# Patient Record
Sex: Female | Born: 1975 | Race: White | Hispanic: No | Marital: Married | Smoking: Never smoker
Health system: Southern US, Community
[De-identification: ages and names within clinical notes are randomized; demographics above are authoritative.]

## PROBLEM LIST (undated history)

## (undated) DIAGNOSIS — N809 Endometriosis, unspecified: Secondary | ICD-10-CM

## (undated) DIAGNOSIS — F329 Major depressive disorder, single episode, unspecified: Secondary | ICD-10-CM

## (undated) DIAGNOSIS — R5382 Chronic fatigue, unspecified: Secondary | ICD-10-CM

## (undated) DIAGNOSIS — D649 Anemia, unspecified: Secondary | ICD-10-CM

## (undated) DIAGNOSIS — F419 Anxiety disorder, unspecified: Secondary | ICD-10-CM

## (undated) DIAGNOSIS — R109 Unspecified abdominal pain: Secondary | ICD-10-CM

## (undated) DIAGNOSIS — K644 Residual hemorrhoidal skin tags: Secondary | ICD-10-CM

## (undated) DIAGNOSIS — K529 Noninfective gastroenteritis and colitis, unspecified: Secondary | ICD-10-CM

## (undated) DIAGNOSIS — C801 Malignant (primary) neoplasm, unspecified: Secondary | ICD-10-CM

## (undated) DIAGNOSIS — K589 Irritable bowel syndrome without diarrhea: Secondary | ICD-10-CM

## (undated) DIAGNOSIS — R110 Nausea: Secondary | ICD-10-CM

## (undated) DIAGNOSIS — F32A Depression, unspecified: Secondary | ICD-10-CM

## (undated) HISTORY — DX: Irritable bowel syndrome without diarrhea: K58.9

## (undated) HISTORY — DX: Nausea: R11.0

## (undated) HISTORY — PX: OTHER SURGICAL HISTORY: SHX169

## (undated) HISTORY — DX: Residual hemorrhoidal skin tags: K64.4

## (undated) HISTORY — DX: Noninfective gastroenteritis and colitis, unspecified: K52.9

## (undated) HISTORY — PX: COLONOSCOPY: SHX174

## (undated) HISTORY — PX: WISDOM TOOTH EXTRACTION: SHX21

## (undated) HISTORY — DX: Chronic fatigue, unspecified: R53.82

## (undated) HISTORY — DX: Unspecified abdominal pain: R10.9

---

## 2010-06-20 ENCOUNTER — Emergency Department (HOSPITAL_COMMUNITY)
Admission: EM | Admit: 2010-06-20 | Discharge: 2010-06-21 | Disposition: A | Discharge status: Home / Self Care | Payer: BC Managed Care – PPO | Attending: Emergency Medicine | Admitting: Emergency Medicine

## 2010-06-20 DIAGNOSIS — R110 Nausea: Secondary | ICD-10-CM | POA: Insufficient documentation

## 2010-06-20 DIAGNOSIS — I498 Other specified cardiac arrhythmias: Secondary | ICD-10-CM | POA: Insufficient documentation

## 2010-06-20 DIAGNOSIS — R002 Palpitations: Secondary | ICD-10-CM | POA: Insufficient documentation

## 2010-06-21 LAB — CBC
HCT: 39.8 % (ref 36.0–46.0)
MCHC: 34.4 g/dL (ref 30.0–36.0)
MCV: 84.7 fL (ref 78.0–100.0)
Platelets: 206 10*3/uL (ref 150–400)
RDW: 12.6 % (ref 11.5–15.5)
WBC: 10.6 10*3/uL — ABNORMAL HIGH (ref 4.0–10.5)

## 2010-06-21 LAB — DIFFERENTIAL
Basophils Absolute: 0 10*3/uL (ref 0.0–0.1)
Eosinophils Absolute: 0 10*3/uL (ref 0.0–0.7)
Eosinophils Relative: 0 % (ref 0–5)
Lymphocytes Relative: 10 % — ABNORMAL LOW (ref 12–46)
Lymphs Abs: 1.1 10*3/uL (ref 0.7–4.0)
Monocytes Absolute: 0.5 10*3/uL (ref 0.1–1.0)

## 2010-06-21 LAB — COMPREHENSIVE METABOLIC PANEL
Albumin: 4 g/dL (ref 3.5–5.2)
BUN: 11 mg/dL (ref 6–23)
Calcium: 9.7 mg/dL (ref 8.4–10.5)
Glucose, Bld: 140 mg/dL — ABNORMAL HIGH (ref 70–99)
Total Protein: 7.3 g/dL (ref 6.0–8.3)

## 2010-06-21 LAB — LIPASE, BLOOD: Lipase: 31 U/L (ref 11–59)

## 2010-07-11 ENCOUNTER — Ambulatory Visit (INDEPENDENT_AMBULATORY_CARE_PROVIDER_SITE_OTHER): Payer: BC Managed Care – PPO | Admitting: Internal Medicine

## 2010-07-11 DIAGNOSIS — R197 Diarrhea, unspecified: Secondary | ICD-10-CM

## 2010-07-11 DIAGNOSIS — R198 Other specified symptoms and signs involving the digestive system and abdomen: Secondary | ICD-10-CM

## 2010-07-29 DIAGNOSIS — F419 Anxiety disorder, unspecified: Secondary | ICD-10-CM | POA: Insufficient documentation

## 2010-07-30 ENCOUNTER — Emergency Department (HOSPITAL_COMMUNITY): Payer: BC Managed Care – PPO

## 2010-07-30 ENCOUNTER — Emergency Department (HOSPITAL_COMMUNITY)
Admission: EM | Admit: 2010-07-30 | Discharge: 2010-07-30 | Disposition: A | Discharge status: Home / Self Care | Payer: BC Managed Care – PPO | Attending: Emergency Medicine | Admitting: Emergency Medicine

## 2010-07-30 DIAGNOSIS — E860 Dehydration: Secondary | ICD-10-CM | POA: Insufficient documentation

## 2010-07-30 DIAGNOSIS — R109 Unspecified abdominal pain: Secondary | ICD-10-CM | POA: Insufficient documentation

## 2010-07-30 DIAGNOSIS — R112 Nausea with vomiting, unspecified: Secondary | ICD-10-CM | POA: Insufficient documentation

## 2010-07-30 LAB — DIFFERENTIAL
Eosinophils Absolute: 0.1 10*3/uL (ref 0.0–0.7)
Lymphocytes Relative: 18 % (ref 12–46)
Lymphs Abs: 0.8 10*3/uL (ref 0.7–4.0)
Monocytes Relative: 8 % (ref 3–12)
Neutro Abs: 3.2 10*3/uL (ref 1.7–7.7)
Neutrophils Relative %: 73 % (ref 43–77)

## 2010-07-30 LAB — CBC
HCT: 37.2 % (ref 36.0–46.0)
Hemoglobin: 12.8 g/dL (ref 12.0–15.0)
MCH: 29.4 pg (ref 26.0–34.0)
MCV: 85.3 fL (ref 78.0–100.0)
Platelets: 209 10*3/uL (ref 150–400)
RBC: 4.36 MIL/uL (ref 3.87–5.11)
WBC: 4.4 10*3/uL (ref 4.0–10.5)

## 2010-07-30 LAB — COMPREHENSIVE METABOLIC PANEL
Albumin: 3.8 g/dL (ref 3.5–5.2)
Alkaline Phosphatase: 57 U/L (ref 39–117)
BUN: 6 mg/dL (ref 6–23)
CO2: 24 mEq/L (ref 19–32)
Chloride: 107 mEq/L (ref 96–112)
GFR calc non Af Amer: >60 mL/min (ref >60)
Potassium: 3.6 mEq/L (ref 3.5–5.1)
Total Bilirubin: 0.6 mg/dL (ref 0.3–1.2)

## 2010-08-04 ENCOUNTER — Other Ambulatory Visit (INDEPENDENT_AMBULATORY_CARE_PROVIDER_SITE_OTHER): Payer: Self-pay | Admitting: Internal Medicine

## 2010-08-04 DIAGNOSIS — R110 Nausea: Secondary | ICD-10-CM

## 2010-08-04 DIAGNOSIS — R197 Diarrhea, unspecified: Secondary | ICD-10-CM

## 2010-08-08 ENCOUNTER — Encounter (HOSPITAL_COMMUNITY): Payer: Self-pay

## 2010-08-08 ENCOUNTER — Encounter (HOSPITAL_COMMUNITY)
Admission: RE | Admit: 2010-08-08 | Discharge: 2010-08-08 | Disposition: A | Discharge status: Home / Self Care | Payer: BC Managed Care – PPO | Source: Ambulatory Visit | Attending: Internal Medicine | Admitting: Internal Medicine

## 2010-08-08 DIAGNOSIS — R197 Diarrhea, unspecified: Secondary | ICD-10-CM

## 2010-08-08 DIAGNOSIS — R110 Nausea: Secondary | ICD-10-CM

## 2010-08-08 DIAGNOSIS — R112 Nausea with vomiting, unspecified: Secondary | ICD-10-CM | POA: Insufficient documentation

## 2010-08-08 MED ORDER — TECHNETIUM TC 99M MEBROFENIN IV KIT
5.0000 | PACK | Freq: Once | INTRAVENOUS | Status: AC | PRN
  Administered 2010-08-08: 5 via INTRAVENOUS

## 2010-08-25 ENCOUNTER — Ambulatory Visit (HOSPITAL_COMMUNITY)
Admission: RE | Admit: 2010-08-25 | Discharge: 2010-08-25 | Disposition: A | Discharge status: Home / Self Care | Payer: BC Managed Care – PPO | Source: Ambulatory Visit | Attending: Internal Medicine | Admitting: Internal Medicine

## 2010-08-25 ENCOUNTER — Other Ambulatory Visit (INDEPENDENT_AMBULATORY_CARE_PROVIDER_SITE_OTHER): Payer: Self-pay | Admitting: Internal Medicine

## 2010-08-25 ENCOUNTER — Encounter (HOSPITAL_BASED_OUTPATIENT_CLINIC_OR_DEPARTMENT_OTHER): Payer: BC Managed Care – PPO | Admitting: Internal Medicine

## 2010-08-25 DIAGNOSIS — K644 Residual hemorrhoidal skin tags: Secondary | ICD-10-CM

## 2010-08-25 DIAGNOSIS — R197 Diarrhea, unspecified: Secondary | ICD-10-CM | POA: Insufficient documentation

## 2010-08-25 DIAGNOSIS — K5730 Diverticulosis of large intestine without perforation or abscess without bleeding: Secondary | ICD-10-CM | POA: Insufficient documentation

## 2010-08-25 DIAGNOSIS — R634 Abnormal weight loss: Secondary | ICD-10-CM | POA: Insufficient documentation

## 2010-08-25 HISTORY — PX: COLONOSCOPY: SHX174

## 2010-09-05 ENCOUNTER — Other Ambulatory Visit (INDEPENDENT_AMBULATORY_CARE_PROVIDER_SITE_OTHER): Payer: Self-pay | Admitting: Internal Medicine

## 2010-09-05 DIAGNOSIS — R197 Diarrhea, unspecified: Secondary | ICD-10-CM

## 2010-09-05 DIAGNOSIS — R110 Nausea: Secondary | ICD-10-CM

## 2010-09-08 ENCOUNTER — Ambulatory Visit (HOSPITAL_COMMUNITY): Payer: BC Managed Care – PPO

## 2010-09-11 NOTE — Op Note (Signed)
  Kristen Mullins, Kristen Mullins                   ACCOUNT NO.:  [id number]  MEDICAL RECORD NO.:  [id number]           PATIENT TYPE:  O  LOCATION:  [facility]                          FACILITY:  [facility]  PHYSICIAN:  Lionel December, M.D.    DATE OF BIRTH:  [birth year]  DATE OF PROCEDURE:  08/25/2010 DATE OF DISCHARGE:                              OPERATIVE REPORT   PROCEDURE:  Colonoscopy with terminal ileoscopy.  INDICATION:  Kristen Mullins is a 35 year old Caucasian female who has had diarrhea off and on for several years, but worse in the last 9 months.  She was felt to have IBS, but she has not responded well to therapy.  She is, therefore, undergoing diagnostic exam.  Since middle of March, she has lost 10 pounds.  Procedure risks were reviewed with the patient and informed consent was obtained.  MEDICATIONS FOR CONSCIOUS SEDATION: 1. Demerol 25 mg IV. 2. Versed 10 mg IV in divided dose.  FINDINGS:  Procedure performed in endoscopy suite.  The patient's vital signs and O2 sats were monitored during the procedure and remained stable.  The patient was placed in left lateral recumbent position and rectal examination performed.  No abnormality noted on external or digital exam.  Pentax videoscope was placed through rectum and advanced under vision into sigmoid colon beyond.  Preparation was satisfactory. Scope was passed into cecum which was identified by appendiceal orifice and ileocecal valve.  Short segment of GI was also examined and was normal.  There was single small diverticulum at the cecum.  As the scope was withdrawn, colonic mucosa was carefully examined and was normal throughout.  Random biopsies were taken from mucosa of the sigmoid colon looking for microscopic colitis.  Rectal mucosa was normal.  Scope was retroflexed to examine anorectal junction and small hemorrhoids noted below the dentate line.  Endoscope was straightened and withdrawn. Withdrawal time was 11 minutes.  The patient tolerated the  procedure well.  FINAL DIAGNOSES: 1. Normal terminal ileum. 2. Normal colonoscopy except single cecal diverticulum. 3. Random biopsies taken from mucosa of sigmoid colon looking for     microscopic and/or collagenous colitis. 4. External hemorrhoids.  RECOMMENDATIONS: 1. High-fiber diet plus fiber supplement 3-4 g daily. 2. Dicyclomine 10 mg before breakfast and evening meal, prescription     given for 60 with 5 refills. 3. She was also given prescription for promethazine 25 mg tablets 20     that she can take half to 1 tablet b.i.d. p.r.n.     Lionel December, M.D.     NR/MEDQ  D:  08/25/2010  T:  08/26/2010  Job:  [id number]  cc:   Ernestina Penna, M.D. Fax: [phone]  Electronically Signed by Lionel December M.D. on 09/11/2010 09:46:08 PM

## 2010-09-12 ENCOUNTER — Ambulatory Visit (INDEPENDENT_AMBULATORY_CARE_PROVIDER_SITE_OTHER): Payer: BC Managed Care – PPO | Admitting: Internal Medicine

## 2010-09-12 DIAGNOSIS — R110 Nausea: Secondary | ICD-10-CM

## 2010-09-12 DIAGNOSIS — R634 Abnormal weight loss: Secondary | ICD-10-CM

## 2010-09-12 DIAGNOSIS — R197 Diarrhea, unspecified: Secondary | ICD-10-CM

## 2011-01-02 ENCOUNTER — Encounter (INDEPENDENT_AMBULATORY_CARE_PROVIDER_SITE_OTHER): Payer: Self-pay | Admitting: *Deleted

## 2011-01-18 ENCOUNTER — Encounter (INDEPENDENT_AMBULATORY_CARE_PROVIDER_SITE_OTHER): Payer: Self-pay

## 2011-01-26 ENCOUNTER — Ambulatory Visit (INDEPENDENT_AMBULATORY_CARE_PROVIDER_SITE_OTHER): Payer: BC Managed Care – PPO | Admitting: Internal Medicine

## 2012-10-28 ENCOUNTER — Ambulatory Visit (INDEPENDENT_AMBULATORY_CARE_PROVIDER_SITE_OTHER): Payer: BC Managed Care – PPO | Admitting: Family Medicine

## 2012-10-28 ENCOUNTER — Encounter: Payer: Self-pay | Admitting: Family Medicine

## 2012-10-28 VITALS — BP 107/72 | HR 78 | Temp 99.1°F | Ht 69.0 in | Wt 126.6 lb

## 2012-10-28 DIAGNOSIS — Z0000 Encounter for general adult medical examination without abnormal findings: Secondary | ICD-10-CM

## 2012-10-28 DIAGNOSIS — F411 Generalized anxiety disorder: Secondary | ICD-10-CM

## 2012-10-28 LAB — POCT CBC
Granulocyte percent: 72.7 %G (ref 37–80)
HCT, POC: 38.8 % (ref 37.7–47.9)
Hemoglobin: 13.7 g/dL (ref 12.2–16.2)
Lymph, poc: 1.7 (ref 0.6–3.4)
MCH, POC: 29.9 pg (ref 27–31.2)
MCHC: 35.2 g/dL (ref 31.8–35.4)
MCV: 84.9 fL (ref 80–97)
MPV: 8.2 fL (ref 0–99.8)
POC Granulocyte: 5.2 (ref 2–6.9)
POC LYMPH PERCENT: 23.3 %L (ref 10–50)
Platelet Count, POC: 244 10*3/uL (ref 142–424)
RBC: 4.6 M/uL (ref 4.04–5.48)
RDW, POC: 13.5 %
WBC: 7.1 10*3/uL (ref 4.6–10.2)

## 2012-10-28 LAB — LIPID PANEL
Cholesterol: 166 mg/dL (ref 0–200)
HDL: 40 mg/dL (ref >39)
LDL Cholesterol: 82 mg/dL (ref 0–99)
Total CHOL/HDL Ratio: 4.2 Ratio
Triglycerides: 219 mg/dL — ABNORMAL HIGH (ref <150)
VLDL: 44 mg/dL — ABNORMAL HIGH (ref 0–40)

## 2012-10-28 LAB — TSH: TSH: 1.043 u[IU]/mL (ref 0.350–4.500)

## 2012-10-28 MED ORDER — ALPRAZOLAM 0.5 MG PO TABS: 0.5000 mg | ORAL_TABLET | Freq: Every day | ORAL | Status: DC

## 2012-10-28 NOTE — Patient Instructions (Signed)

## 2012-10-28 NOTE — Progress Notes (Signed)
  Subjective:    Patient ID: Kristen Mullins, female    DOB: 07-Nov-1975, 37 y.o.   MRN: [id number]  HPI This 37 y.o. female presents for evaluation of Anxiety.  She uses xanax one per day for anxiety. She has cut back from 2 a day.  She does use xanax bid on rare occasion.  She is not on maintenance GAD medication.  She has been on lexapro but had SE's and stopped.  She has no annual labs this year.  She sees OBGYN for female visits and is current.  She was told by OBGYN to take vitaminD otc.    Review of Systems    No chest pain, SOB, HA, dizziness, vision change, N/V, diarrhea, constipation, dysuria, urinary urgency or frequency, myalgias, arthralgias or rash.  Objective:   Physical Exam Vital signs noted  Well developed well nourished female in NAD.  HEENT - Head atraumatic Normocephalic                Eyes - PERRLA, Conjuctiva - clear Sclera- Clear EOMI                Ears - EAC's Wnl TM's Wnl Gross Hearing WNL                Nose - Nares patent                 Throat - oropharanx wnl Respiratory - Lungs CTA bilateral Cardiac - RRR S1 and S2 without murmur GI - Abdomen soft Nontender and bowel sounds active x 4 Extremities - No edema. Neuro - Grossly intact.       Assessment & Plan:  Routine general medical examination at a health care facility - Plan: POCT CBC, Lipid panel, COMPLETE METABOLIC PANEL WITH GFR, TSH.  Take VitaminD and calcium otc.  Follow up with OGBYN for female exam.  Generalized anxiety disorder - Plan: ALPRAZolam (XANAX) 0.5 MG tablet,  Discussed if she escalates xanax then would add an SSRI.  Follow up in 3-86months for xanax refill.

## 2012-10-29 LAB — COMPLETE METABOLIC PANEL WITH GFR
ALT: 21 U/L (ref 0–35)
AST: 17 U/L (ref 0–37)
Albumin: 3.9 g/dL (ref 3.5–5.2)
Alkaline Phosphatase: 42 U/L (ref 39–117)
BUN: 10 mg/dL (ref 6–23)
CO2: 26 mEq/L (ref 19–32)
Calcium: 9.3 mg/dL (ref 8.4–10.5)
Chloride: 104 mEq/L (ref 96–112)
Creat: 0.84 mg/dL (ref 0.50–1.10)
GFR, Est African American: >89 mL/min
GFR, Est Non African American: 89 mL/min
Glucose, Bld: 84 mg/dL (ref 70–99)
Potassium: 4.3 mEq/L (ref 3.5–5.3)
Sodium: 138 mEq/L (ref 135–145)
Total Bilirubin: 0.4 mg/dL (ref 0.3–1.2)
Total Protein: 6.8 g/dL (ref 6.0–8.3)

## 2012-12-24 ENCOUNTER — Other Ambulatory Visit: Payer: Self-pay | Admitting: Dermatology

## 2013-06-10 ENCOUNTER — Encounter (HOSPITAL_COMMUNITY): Payer: Self-pay | Admitting: Pharmacist

## 2013-06-16 ENCOUNTER — Other Ambulatory Visit: Payer: Self-pay | Admitting: Dermatology

## 2013-06-18 ENCOUNTER — Encounter (HOSPITAL_COMMUNITY)
Admission: RE | Admit: 2013-06-18 | Discharge: 2013-06-18 | Disposition: A | Discharge status: Home / Self Care | Payer: BC Managed Care – PPO | Source: Ambulatory Visit | Attending: Obstetrics and Gynecology | Admitting: Obstetrics and Gynecology

## 2013-06-18 ENCOUNTER — Encounter (HOSPITAL_COMMUNITY): Payer: Self-pay

## 2013-06-18 DIAGNOSIS — Z01812 Encounter for preprocedural laboratory examination: Secondary | ICD-10-CM | POA: Insufficient documentation

## 2013-06-18 HISTORY — DX: Depression, unspecified: F32.A

## 2013-06-18 HISTORY — DX: Anemia, unspecified: D64.9

## 2013-06-18 HISTORY — DX: Anxiety disorder, unspecified: F41.9

## 2013-06-18 HISTORY — DX: Malignant (primary) neoplasm, unspecified: C80.1

## 2013-06-18 HISTORY — DX: Major depressive disorder, single episode, unspecified: F32.9

## 2013-06-18 HISTORY — DX: Endometriosis, unspecified: N80.9

## 2013-06-18 LAB — CBC
HCT: 37.4 % (ref 36.0–46.0)
Hemoglobin: 13.1 g/dL (ref 12.0–15.0)
MCH: 29.6 pg (ref 26.0–34.0)
MCHC: 35 g/dL (ref 30.0–36.0)
MCV: 84.6 fL (ref 78.0–100.0)
PLATELETS: 300 10*3/uL (ref 150–400)
RBC: 4.42 MIL/uL (ref 3.87–5.11)
RDW: 12.7 % (ref 11.5–15.5)
WBC: 13.9 10*3/uL — ABNORMAL HIGH (ref 4.0–10.5)

## 2013-06-18 NOTE — Patient Instructions (Addendum)
   Your procedure is scheduled on:  Monday, Feb 16  Enter through the Main Entrance of [facility] at:  6 AM Pick up the phone at the desk and dial [phone] and inform us of your arrival.  Please call this number if you have any problems the morning of surgery: [phone]  Remember: Do not eat or drink after midnight: Sunday Take these medicines the morning of surgery with a SIP OF WATER:  Xanax if needed.  Do not wear jewelry, make-up, or FINGER nail polish No metal in your hair or on your body. Do not wear lotions, powders, perfumes.  You may wear deodorant.  Do not bring valuables to the hospital. Contacts, dentures or bridgework may not be worn into surgery.  Patients discharged on the day of surgery will not be allowed to drive home.  Home with husband Juanda Crumble cell  [phone] or sister Amy.

## 2013-06-20 ENCOUNTER — Other Ambulatory Visit: Payer: Self-pay | Admitting: Family Medicine

## 2013-06-20 ENCOUNTER — Other Ambulatory Visit: Payer: Self-pay | Admitting: *Deleted

## 2013-06-20 DIAGNOSIS — F411 Generalized anxiety disorder: Secondary | ICD-10-CM

## 2013-06-20 NOTE — Telephone Encounter (Signed)
ntbs

## 2013-06-20 NOTE — Telephone Encounter (Signed)
Patient last seen in office on 10-28-12. Rx last filled on 04-23-13. Please advise. If approved please route to Pool A so nurse can phone in to [facility] in [place]

## 2013-06-22 NOTE — H&P (Addendum)
38 yo with chronic pelvic pain presents for surgical mngt.  Despite extended cycle OCPs, she continues to have severe dysmenorrhea, n/v/d with menses.  PMHx:  allergies PSHx: neg All:  Hydrocodone Meds:  Camrese, xanex, allergy pill FHx:  Breast ca - PGM, lung ca SHx:  Negative  AF, VSS Gen - NAD Abd - soft, NT CV - RRR Lungs - clear PV - uterus mobile, NT  A/P:  Chronic pelvic pain Diagnostic Laparoscopy with possible fulgeration of endometriosis R/b/a of surgery discussed, informed consent Pt declines blood products.

## 2013-06-23 ENCOUNTER — Ambulatory Visit (HOSPITAL_COMMUNITY)
Admission: RE | Admit: 2013-06-23 | Discharge: 2013-06-23 | Disposition: A | Discharge status: Home / Self Care | Payer: BC Managed Care – PPO | Source: Ambulatory Visit | Attending: Obstetrics and Gynecology | Admitting: Obstetrics and Gynecology

## 2013-06-23 ENCOUNTER — Encounter (HOSPITAL_COMMUNITY)
Admission: RE | Disposition: A | Discharge status: Home / Self Care | Payer: Self-pay | Source: Ambulatory Visit | Attending: Obstetrics and Gynecology

## 2013-06-23 ENCOUNTER — Encounter (HOSPITAL_COMMUNITY): Payer: Self-pay | Admitting: *Deleted

## 2013-06-23 ENCOUNTER — Encounter (HOSPITAL_COMMUNITY): Payer: BC Managed Care – PPO | Admitting: Anesthesiology

## 2013-06-23 ENCOUNTER — Ambulatory Visit (HOSPITAL_COMMUNITY): Payer: BC Managed Care – PPO | Admitting: Anesthesiology

## 2013-06-23 DIAGNOSIS — Y9210 Unspecified residential institution as the place of occurrence of the external cause: Secondary | ICD-10-CM | POA: Insufficient documentation

## 2013-06-23 DIAGNOSIS — N946 Dysmenorrhea, unspecified: Secondary | ICD-10-CM | POA: Insufficient documentation

## 2013-06-23 DIAGNOSIS — N803 Endometriosis of pelvic peritoneum, unspecified: Secondary | ICD-10-CM | POA: Insufficient documentation

## 2013-06-23 DIAGNOSIS — IMO0002 Reserved for concepts with insufficient information to code with codable children: Secondary | ICD-10-CM | POA: Insufficient documentation

## 2013-06-23 DIAGNOSIS — N949 Unspecified condition associated with female genital organs and menstrual cycle: Secondary | ICD-10-CM | POA: Insufficient documentation

## 2013-06-23 HISTORY — PX: LAPAROSCOPY: SHX197

## 2013-06-23 SURGERY — LAPAROSCOPY OPERATIVE
Anesthesia: General | Site: Abdomen

## 2013-06-23 MED ORDER — FERRIC SUBSULFATE 259 MG/GM EX SOLN
CUTANEOUS | Status: DC | PRN
  Administered 2013-06-23: 1

## 2013-06-23 MED ORDER — MIDAZOLAM HCL 2 MG/2ML IJ SOLN
INTRAMUSCULAR | Status: DC | PRN
  Administered 2013-06-23: 2 mg via INTRAVENOUS

## 2013-06-23 MED ORDER — KETOROLAC TROMETHAMINE 30 MG/ML IJ SOLN
INTRAMUSCULAR | Status: AC
  Filled 2013-06-23: qty 1

## 2013-06-23 MED ORDER — KETOROLAC TROMETHAMINE 60 MG/2ML IM SOLN
INTRAMUSCULAR | Status: DC | PRN
  Administered 2013-06-23: 30 mg via INTRAMUSCULAR

## 2013-06-23 MED ORDER — GLYCOPYRROLATE 0.2 MG/ML IJ SOLN
INTRAMUSCULAR | Status: DC | PRN
  Administered 2013-06-23: 0.1 mg via INTRAVENOUS
  Administered 2013-06-23: 0.6 mg via INTRAVENOUS

## 2013-06-23 MED ORDER — METOCLOPRAMIDE HCL 5 MG/ML IJ SOLN: 10.0000 mg | Freq: Once | INTRAMUSCULAR | Status: DC | PRN

## 2013-06-23 MED ORDER — FENTANYL CITRATE 0.05 MG/ML IJ SOLN
INTRAMUSCULAR | Status: AC
Start: 2013-06-23 — End: 2013-06-23
  Filled 2013-06-23: qty 5

## 2013-06-23 MED ORDER — FENTANYL CITRATE 0.05 MG/ML IJ SOLN: 25.0000 ug | INTRAMUSCULAR | Status: DC | PRN

## 2013-06-23 MED ORDER — BUPIVACAINE HCL (PF) 0.25 % IJ SOLN
INTRAMUSCULAR | Status: AC
  Filled 2013-06-23: qty 30

## 2013-06-23 MED ORDER — ONDANSETRON HCL 4 MG/2ML IJ SOLN
INTRAMUSCULAR | Status: DC | PRN
  Administered 2013-06-23: 4 mg via INTRAVENOUS

## 2013-06-23 MED ORDER — HYDROMORPHONE HCL 2 MG PO TABS: 1.0000 mg | ORAL_TABLET | ORAL | Status: DC | PRN

## 2013-06-23 MED ORDER — DEXTROSE 5 % IV SOLN
2.0000 g | INTRAVENOUS | Status: AC
  Administered 2013-06-23: 2 g via INTRAVENOUS
  Filled 2013-06-23: qty 2

## 2013-06-23 MED ORDER — ACETAMINOPHEN 160 MG/5ML PO SOLN
ORAL | Status: AC
  Filled 2013-06-23: qty 40.6

## 2013-06-23 MED ORDER — NEOSTIGMINE METHYLSULFATE 1 MG/ML IJ SOLN
INTRAMUSCULAR | Status: AC
Start: 2013-06-23 — End: 2013-06-23
  Filled 2013-06-23: qty 1

## 2013-06-23 MED ORDER — ROCURONIUM BROMIDE 100 MG/10ML IV SOLN
INTRAVENOUS | Status: AC
  Filled 2013-06-23: qty 1

## 2013-06-23 MED ORDER — GLYCOPYRROLATE 0.2 MG/ML IJ SOLN
INTRAMUSCULAR | Status: AC
Start: 2013-06-23 — End: 2013-06-23
  Filled 2013-06-23: qty 1

## 2013-06-23 MED ORDER — KETOROLAC TROMETHAMINE 30 MG/ML IJ SOLN
INTRAMUSCULAR | Status: DC | PRN
  Administered 2013-06-23: 30 mg via INTRAVENOUS

## 2013-06-23 MED ORDER — DEXAMETHASONE SODIUM PHOSPHATE 10 MG/ML IJ SOLN
INTRAMUSCULAR | Status: DC | PRN
  Administered 2013-06-23: 10 mg via INTRAVENOUS

## 2013-06-23 MED ORDER — MIDAZOLAM HCL 2 MG/2ML IJ SOLN
INTRAMUSCULAR | Status: AC
  Filled 2013-06-23: qty 2

## 2013-06-23 MED ORDER — KETOROLAC TROMETHAMINE 30 MG/ML IJ SOLN: 15.0000 mg | Freq: Once | INTRAMUSCULAR | Status: DC | PRN

## 2013-06-23 MED ORDER — FENTANYL CITRATE 0.05 MG/ML IJ SOLN
INTRAMUSCULAR | Status: DC | PRN
  Administered 2013-06-23 (×3): 50 ug via INTRAVENOUS
  Administered 2013-06-23: 100 ug via INTRAVENOUS

## 2013-06-23 MED ORDER — FERRIC SUBSULFATE 259 MG/GM EX SOLN
CUTANEOUS | Status: AC
  Filled 2013-06-23: qty 8

## 2013-06-23 MED ORDER — BUPIVACAINE HCL (PF) 0.25 % IJ SOLN
INTRAMUSCULAR | Status: AC
  Filled 2013-06-23: qty 10

## 2013-06-23 MED ORDER — NEOSTIGMINE METHYLSULFATE 1 MG/ML IJ SOLN
INTRAMUSCULAR | Status: DC | PRN
  Administered 2013-06-23: 3 mg via INTRAVENOUS

## 2013-06-23 MED ORDER — PROPOFOL 10 MG/ML IV EMUL
INTRAVENOUS | Status: AC
  Filled 2013-06-23: qty 20

## 2013-06-23 MED ORDER — PROPOFOL 10 MG/ML IV BOLUS
INTRAVENOUS | Status: DC | PRN
  Administered 2013-06-23: 150 mg via INTRAVENOUS
  Administered 2013-06-23: 30 mg via INTRAVENOUS

## 2013-06-23 MED ORDER — BUPIVACAINE HCL (PF) 0.25 % IJ SOLN
INTRAMUSCULAR | Status: DC | PRN
  Administered 2013-06-23: 4 mL

## 2013-06-23 MED ORDER — ACETAMINOPHEN 160 MG/5ML PO SOLN
975.0000 mg | Freq: Once | ORAL | Status: AC
  Administered 2013-06-23: 975 mg via ORAL

## 2013-06-23 MED ORDER — LIDOCAINE HCL (CARDIAC) 20 MG/ML IV SOLN
INTRAVENOUS | Status: DC | PRN
Start: 2013-06-23 — End: 2013-06-23
  Administered 2013-06-23: 60 mg via INTRAVENOUS

## 2013-06-23 MED ORDER — LACTATED RINGERS IV SOLN
INTRAVENOUS | Status: DC
  Administered 2013-06-23 (×2): via INTRAVENOUS

## 2013-06-23 MED ORDER — DEXAMETHASONE SODIUM PHOSPHATE 10 MG/ML IJ SOLN
INTRAMUSCULAR | Status: AC
  Filled 2013-06-23: qty 1

## 2013-06-23 MED ORDER — SILVER NITRATE-POT NITRATE 75-25 % EX MISC
CUTANEOUS | Status: AC
  Filled 2013-06-23: qty 3

## 2013-06-23 MED ORDER — ROCURONIUM BROMIDE 100 MG/10ML IV SOLN
INTRAVENOUS | Status: DC | PRN
  Administered 2013-06-23: 5 mg via INTRAVENOUS
  Administered 2013-06-23: 20 mg via INTRAVENOUS

## 2013-06-23 MED ORDER — IBUPROFEN 600 MG PO TABS: 600.0000 mg | ORAL_TABLET | Freq: Four times a day (QID) | ORAL | Status: DC | PRN

## 2013-06-23 MED ORDER — ONDANSETRON HCL 4 MG/2ML IJ SOLN
INTRAMUSCULAR | Status: AC
  Filled 2013-06-23: qty 2

## 2013-06-23 MED ORDER — ROCURONIUM BROMIDE 100 MG/10ML IV SOLN: INTRAVENOUS | Status: DC | PRN

## 2013-06-23 MED ORDER — GLYCOPYRROLATE 0.2 MG/ML IJ SOLN
INTRAMUSCULAR | Status: AC
  Filled 2013-06-23: qty 3

## 2013-06-23 MED ORDER — LIDOCAINE HCL (CARDIAC) 20 MG/ML IV SOLN
INTRAVENOUS | Status: AC
Start: 2013-06-23 — End: 2013-06-23
  Filled 2013-06-23: qty 5

## 2013-06-23 MED ORDER — MEPERIDINE HCL 25 MG/ML IJ SOLN: 6.2500 mg | INTRAMUSCULAR | Status: DC | PRN

## 2013-06-23 SURGICAL SUPPLY — 29 items
CATH ROBINSON RED A/P 16FR (CATHETERS) ×3 IMPLANT
CHLORAPREP W/TINT 26ML (MISCELLANEOUS) ×6 IMPLANT
CLOTH BEACON ORANGE TIMEOUT ST (SAFETY) ×3 IMPLANT
DERMABOND ADVANCED (GAUZE/BANDAGES/DRESSINGS) ×2
DERMABOND ADVANCED .7 DNX12 (GAUZE/BANDAGES/DRESSINGS) ×1 IMPLANT
GLOVE BIO SURGEON STRL SZ 6.5 (GLOVE) ×4 IMPLANT
GLOVE BIO SURGEONS STRL SZ 6.5 (GLOVE) ×2
GLOVE BIOGEL PI IND STRL 6.5 (GLOVE) ×3 IMPLANT
GLOVE BIOGEL PI IND STRL 7.0 (GLOVE) ×1 IMPLANT
GLOVE BIOGEL PI INDICATOR 6.5 (GLOVE) ×6
GLOVE BIOGEL PI INDICATOR 7.0 (GLOVE) ×2
GLOVE ECLIPSE 6.0 STRL STRAW (GLOVE) ×9 IMPLANT
GLOVE SURG SS PI 7.0 STRL IVOR (GLOVE) ×9 IMPLANT
GOWN STRL REUS W/TWL LRG LVL3 (GOWN DISPOSABLE) ×6 IMPLANT
NS IRRIG 1000ML POUR BTL (IV SOLUTION) ×3 IMPLANT
PACK LAPAROSCOPY BASIN (CUSTOM PROCEDURE TRAY) ×3 IMPLANT
PROTECTOR NERVE ULNAR (MISCELLANEOUS) ×6 IMPLANT
SCOPETTES 8  STERILE (MISCELLANEOUS) ×2
SCOPETTES 8 STERILE (MISCELLANEOUS) ×1 IMPLANT
SUT VIC AB 0 CT1 27 (SUTURE) ×2
SUT VIC AB 0 CT1 27XBRD ANBCTR (SUTURE) ×1 IMPLANT
SUT VIC AB 3-0 PS2 18 (SUTURE) ×2
SUT VIC AB 3-0 PS2 18XBRD (SUTURE) ×1 IMPLANT
SUT VICRYL 0 UR6 27IN ABS (SUTURE) ×3 IMPLANT
TOWEL OR 17X24 6PK STRL BLUE (TOWEL DISPOSABLE) ×6 IMPLANT
TROCAR OPTI TIP 5M 100M (ENDOMECHANICALS) ×3 IMPLANT
TROCAR XCEL NON-BLD 11X100MML (ENDOMECHANICALS) ×3 IMPLANT
WARMER LAPAROSCOPE (MISCELLANEOUS) ×3 IMPLANT
WATER STERILE IRR 1000ML POUR (IV SOLUTION) ×3 IMPLANT

## 2013-06-23 NOTE — Op Note (Signed)
Kristen Mullins, Kristen Mullins NO.:  [id number]  MEDICAL RECORD NO.:  [id number]  LOCATION:  [facility]                          FACILITY:  [facility]  PHYSICIAN:  Marylynn Pearson, MD    DATE OF BIRTH:  Feb 13, 1976  DATE OF PROCEDURE:  06/23/2013 DATE OF DISCHARGE:                              OPERATIVE REPORT   PROCEDURE:  Repair of cervical laceration.  I was called back to the operating room from the PACU as the patient was having more vaginal bleeding than expected.  Bivalve speculum was placed in the vagina and upon inspection she had significant amount of bleeding from the anterior lip of the cervix where the Hulka clamp had been placed.  Two figure-of-eight stitches using Vicryl were performed. Hemostasis was achieved and Monsel's was then applied.  The speculum was removed and the patient was taken to the recovery room in stable condition.     Marylynn Pearson, MD     GA/MEDQ  D:  06/23/2013  T:  06/23/2013  Job:  [id number]

## 2013-06-23 NOTE — Discharge Instructions (Signed)
DISCHARGE INSTRUCTIONS: Laparoscopy  The following instructions have been prepared to help you care for yourself upon your return home today.  No ibuprofen containing products (ie Advil, Aleve, Motrin, etc) until after 2 pm today.  Wound care:  Do not get the incision wet for the first 24 hours. The incision should be kept clean and dry.  The Band-Aids or dressings may be removed the day after surgery.  Should the incision become sore, red, and swollen after the first week, check with your doctor.  Personal hygiene:  Shower the day after your procedure.  Activity and limitations:  Do NOT drive or operate any equipment today.  Do NOT lift anything more than 15 pounds for 2-3 weeks after surgery.  Do NOT rest in bed all day.  Walking is encouraged. Walk each day, starting slowly with 5-minute walks 3 or 4 times a day. Slowly increase the length of your walks.  Walk up and down stairs slowly.  Do NOT do strenuous activities, such as golfing, playing tennis, bowling, running, biking, weight lifting, gardening, mowing, or vacuuming for 2-4 weeks. Ask your doctor when it is okay to start.  Diet: Eat a light meal as desired this evening. You may resume your usual diet tomorrow.  Return to work: This is dependent on the type of work you do. For the most part you can return to a desk job within a week of surgery. If you are more active at work, please discuss this with your doctor.  What to expect after your surgery: You may have a slight burning sensation when you urinate on the first day. You may have a very small amount of blood in the urine. Expect to have a small amount of vaginal discharge/light bleeding for 1-2 weeks. It is not unusual to have abdominal soreness and bruising for up to 2 weeks. You may be tired and need more rest for about 1 week. You may experience shoulder pain for 24-72 hours. Lying flat in bed may relieve it.  Call your doctor for any of the following:   Develop a fever of 100.4 or greater  Inability to urinate 6 hours after discharge from hospital  Severe pain not relieved by pain medications  Persistent of heavy bleeding at incision site  Redness or swelling around incision site after a week  Increasing nausea or vomiting  Patient Signature________________________________________ Nurse Signature_________________________________________

## 2013-06-23 NOTE — Anesthesia Procedure Notes (Signed)
Procedure Name: Intubation Date/Time: 06/23/2013 7:34 AM Performed by: Flossie Dibble Pre-anesthesia Checklist: Suction available, Emergency Drugs available, Timeout performed, Patient identified and Patient being monitored Patient Re-evaluated:Patient Re-evaluated prior to inductionOxygen Delivery Method: Circle system utilized Preoxygenation: Pre-oxygenation with 100% oxygen Intubation Type: IV induction Ventilation: Mask ventilation without difficulty Laryngoscope Size: Mac and 3 Grade View: Grade I Tube size: 7.0 mm Number of attempts: 1 Placement Confirmation: ETT inserted through vocal cords under direct vision,  positive ETCO2 and breath sounds checked- equal and bilateral Secured at: 21 cm Tube secured with: Tape Dental Injury: Teeth and Oropharynx as per pre-operative assessment

## 2013-06-23 NOTE — Op Note (Signed)
NAMEEDNAH, HAMMOCK NO.:  [id number]  MEDICAL RECORD NO.:  [id number]  LOCATION:  [facility]                          FACILITY:  [facility]  PHYSICIAN:  Marylynn Pearson, MD    DATE OF BIRTH:  07-28-1975  DATE OF PROCEDURE:  06/23/2013 DATE OF DISCHARGE:                              OPERATIVE REPORT   PREOPERATIVE DIAGNOSES: 1. Chronic pelvic pain. 2. Severe dysmenorrhea.  POSTOPERATIVE DIAGNOSES: 1. Chronic pelvic pain. 2. Severe dysmenorrhea. 3. Suspect endometriosis.  PROCEDURE: 1. Diagnostic laparoscopy. 2. Fulguration of endometriosis.  SURGEON:  Marylynn Pearson, MD.  BLOOD LOSS:  Less than 100 mL.  COMPLICATIONS:  None.  CONDITION:  Stable to recovery room.  PROCEDURE IN DETAIL:  The patient was taken to the operating room after informed consent was obtained.  She was given general anesthesia, placed in the dorsal lithotomy position using Allen stirrups.  She was prepped and draped in sterile fashion.  An in and out catheter was used to drain her bladder.  Bivalve speculum was placed in the vagina.  Single-tooth tenaculum was attached to the anterior lip of the cervix.  Hulka clamp was placed.  Tenaculum and speculum were removed and our attention was turned to the abdomen.  Marcaine 0.25% was used to provide local anesthesia at the infraumbilical and suprapubic incision.  Optical trocar was inserted under direct visualization.  After a skin incision was made with a scalpel.  The patient was placed in Trendelenburg position, and a survey was performed.  She had some thin filmy adhesions of the colon diffusely to the right abdominal pelvic sidewall.  She had some contractures throughout the pelvis of the peritoneum.  One small powder burn appearing lesion was noted on the rectum.  However, her pelvis appeared consistent with endometriosis.  Bilateral ovaries and fallopian tubes appeared free of adhesions and normal, uterus appeared normal. Suprapubic  incision was made with a scalpel and 5-mm trocar was inserted under direct visualization.  Bipolar was inserted and fulguration was performed at the site of multiple areas of contracture.  All instruments and trocars were then removed.  A deep stitch was placed in the infraumbilical skin incision.  The skin was closed with Vicryl and Dermabond was placed.  Hulka clamp was removed. She was taken to the recovery room in stable condition.     Marylynn Pearson, MD     GA/MEDQ  D:  06/23/2013  T:  06/23/2013  Job:  [id number]

## 2013-06-23 NOTE — Anesthesia Postprocedure Evaluation (Signed)
  Anesthesia Post-op Note  Patient: Kristen Mullins  Procedure(s) Performed: Procedure(s) with comments: LAPAROSCOPY OPERATIVE AND FULGRATION OF ENDOMETRIOSIS (N/A) - cervical laceration repair   Patient Location: PACU  Anesthesia Type:General  Level of Consciousness: awake, alert  and oriented  Airway and Oxygen Therapy: Patient Spontanous Breathing  Post-op Pain: mild  Post-op Assessment: Post-op Vital signs reviewed, Patient's Cardiovascular Status Stable, Respiratory Function Stable, Patent Airway, No signs of Nausea or vomiting and Pain level controlled  Post-op Vital Signs: Reviewed and stable  Complications: No apparent anesthesia complications

## 2013-06-23 NOTE — Transfer of Care (Signed)
Immediate Anesthesia Transfer of Care Note  Patient: Kristen Mullins  Procedure(s) Performed: Procedure(s) with comments: LAPAROSCOPY OPERATIVE AND FULGRATION OF ENDOMETRIOSIS (N/A) - cervical laceration repair   Patient Location: PACU  Anesthesia Type:General  Level of Consciousness: awake, alert  and oriented  Airway & Oxygen Therapy: Patient Spontanous Breathing and Patient connected to nasal cannula oxygen  Post-op Assessment: Report given to PACU RN and Post -op Vital signs reviewed and stable  Post vital signs: Reviewed and stable  Complications: No apparent anesthesia complications

## 2013-06-23 NOTE — Telephone Encounter (Signed)
Detailed message left with pharmacy that patient ntbs i tried calling the patient and did not get a answer on either number

## 2013-06-23 NOTE — Anesthesia Preprocedure Evaluation (Signed)
Anesthesia Evaluation  Patient identified by MRN, date of birth, ID band Patient awake    Reviewed: Allergy & Precautions, H&P , NPO status , Patient's Chart, lab work & pertinent test results, reviewed documented beta blocker date and time   History of Anesthesia Complications Negative for: history of anesthetic complications  Airway Mallampati: II TM Distance: >3 FB Neck ROM: full    Dental  (+) Teeth Intact   Pulmonary neg pulmonary ROS,  breath sounds clear to auscultation  Pulmonary exam normal       Cardiovascular Exercise Tolerance: Good negative cardio ROS  Rhythm:regular Rate:Normal     Neuro/Psych PSYCHIATRIC DISORDERS (anxiety, depression, panic attacks) negative neurological ROS     GI/Hepatic negative GI ROS, Neg liver ROS,   Endo/Other  negative endocrine ROS  Renal/GU negative Renal ROS  Female GU complaint     Musculoskeletal   Abdominal   Peds  Hematology  (+) REFUSES BLOOD PRODUCTS,   Anesthesia Other Findings   Reproductive/Obstetrics negative OB ROS                           Anesthesia Physical Anesthesia Plan  ASA: II  Anesthesia Plan: General ETT   Post-op Pain Management:    Induction:   Airway Management Planned:   Additional Equipment:   Intra-op Plan:   Post-operative Plan:   Informed Consent: I have reviewed the patients History and Physical, chart, labs and discussed the procedure including the risks, benefits and alternatives for the proposed anesthesia with the patient or authorized representative who has indicated his/her understanding and acceptance.   Dental Advisory Given  Plan Discussed with: CRNA and Surgeon  Anesthesia Plan Comments:         Anesthesia Quick Evaluation

## 2013-06-24 ENCOUNTER — Encounter (HOSPITAL_COMMUNITY): Payer: Self-pay | Admitting: Obstetrics and Gynecology

## 2013-07-15 ENCOUNTER — Encounter: Payer: Self-pay | Admitting: General Practice

## 2013-07-15 ENCOUNTER — Ambulatory Visit (INDEPENDENT_AMBULATORY_CARE_PROVIDER_SITE_OTHER): Payer: BC Managed Care – PPO | Admitting: General Practice

## 2013-07-15 VITALS — BP 105/73 | HR 83 | Temp 99.0°F | Ht 66.0 in | Wt 130.4 lb

## 2013-07-15 DIAGNOSIS — F411 Generalized anxiety disorder: Secondary | ICD-10-CM

## 2013-07-15 MED ORDER — ALPRAZOLAM 0.5 MG PO TABS: 0.5000 mg | ORAL_TABLET | Freq: Every day | ORAL | Status: DC

## 2013-07-15 NOTE — Patient Instructions (Signed)

## 2013-07-15 NOTE — Progress Notes (Signed)
   Subjective:    Patient ID: Kristen Mullins, female    DOB: 1975-11-14, 38 y.o.   MRN: [id number]  HPI Patient presents today for follow up of anxiety. She has been taking xanax 0.5 qhs for past 3-4 years and effective in helping her sleep.    Review of Systems  Constitutional: Negative for fever and chills.  Respiratory: Negative for chest tightness and shortness of breath.   Cardiovascular: Negative for chest pain and palpitations.  Psychiatric/Behavioral: Negative for suicidal ideas. The patient is nervous/anxious.        Objective:   Physical Exam  Constitutional: She is oriented to person, place, and time. She appears well-developed and well-nourished.  Cardiovascular: Normal rate, regular rhythm and normal heart sounds.   Pulmonary/Chest: Effort normal and breath sounds normal. No respiratory distress. She exhibits no tenderness.  Neurological: She is alert and oriented to person, place, and time.  Skin: Skin is warm and dry.  Psychiatric: She has a normal mood and affect.          Assessment & Plan:  1. Generalized anxiety disorder - ALPRAZolam (XANAX) 0.5 MG tablet; Take 1 tablet (0.5 mg total) by mouth at bedtime.  Dispense: 30 tablet; Refill: 3 Discussed stress reduction, relaxation techniques, and sleep hygiene RTO prn Patient verbalized understanding Erby Pian, FNP-C

## 2013-12-08 ENCOUNTER — Ambulatory Visit (INDEPENDENT_AMBULATORY_CARE_PROVIDER_SITE_OTHER): Payer: 59 | Admitting: Family

## 2013-12-08 ENCOUNTER — Encounter: Payer: Self-pay | Admitting: Family

## 2013-12-08 VITALS — BP 103/71 | HR 82 | Temp 99.8°F | Ht 66.0 in | Wt 124.4 lb

## 2013-12-08 DIAGNOSIS — F411 Generalized anxiety disorder: Secondary | ICD-10-CM | POA: Insufficient documentation

## 2013-12-08 MED ORDER — ALPRAZOLAM 0.5 MG PO TABS: 0.5000 mg | ORAL_TABLET | Freq: Every day | ORAL | Status: DC

## 2013-12-08 NOTE — Progress Notes (Signed)
   Subjective:    Patient ID: Kristen Mullins, female    DOB: [birth year], 38 y.o.   MRN: [id number]  Pt presents to the office for chronic follow-up.  Anxiety Presents for follow-up visit. Symptoms include depressed mood, excessive worry, nervous/anxious behavior and panic. Patient reports no chest pain, irritability, palpitations or shortness of breath. Symptoms occur occasionally. The symptoms are aggravated by family issues.   Her past medical history is significant for anxiety/panic attacks and depression. The treatment provided moderate relief. Compliance with prior treatments has been good.      Review of Systems  Constitutional: Negative.  Negative for irritability.  HENT: Negative.   Eyes: Negative.   Respiratory: Negative.  Negative for shortness of breath.   Cardiovascular: Negative.  Negative for chest pain and palpitations.  Gastrointestinal: Negative.   Endocrine: Negative.   Genitourinary: Negative.   Musculoskeletal: Negative.   Neurological: Negative.  Negative for headaches.  Hematological: Negative.   Psychiatric/Behavioral: The patient is nervous/anxious.   All other systems reviewed and are negative.      Objective:   Physical Exam  Vitals reviewed. Constitutional: She is oriented to person, place, and time. She appears well-developed and well-nourished. No distress.  HENT:  Head: Normocephalic and atraumatic.  Right Ear: External ear normal.  Mouth/Throat: Oropharynx is clear and moist.  Eyes: Pupils are equal, round, and reactive to light.  Neck: Normal range of motion. Neck supple. No thyromegaly present.  Cardiovascular: Normal rate, regular rhythm, normal heart sounds and intact distal pulses.   No murmur heard. Pulmonary/Chest: Effort normal and breath sounds normal. No respiratory distress. She has no wheezes.  Abdominal: Soft. Bowel sounds are normal. She exhibits no distension. There is no tenderness.  Musculoskeletal: Normal range of motion. She  exhibits no edema and no tenderness.  Neurological: She is alert and oriented to person, place, and time. She has normal reflexes. No cranial nerve deficit.  Skin: Skin is warm and dry.  Psychiatric: She has a normal mood and affect. Her behavior is normal. Judgment and thought content normal.    BP 103/71  Pulse 82  Temp(Src) 99.8 F (37.7 C) (Oral)  Ht $R'5\' 6"'mv$  (1.676 m)  Wt 124 lb 6.4 oz (56.427 kg)  BMI 20.09 kg/m2  LMP 11/26/2013       Assessment & Plan:  1. Generalized anxiety disorder - ALPRAZolam (XANAX) 0.5 MG tablet; Take 1 tablet (0.5 mg total) by mouth at bedtime.  Dispense: 30 tablet; Refill: 3 - CMP14+EGFR   Continue all meds Labs pending Health Maintenance reviewed Diet and exercise encouraged RTO 6 months  Evelina Dun, FNP

## 2013-12-08 NOTE — Patient Instructions (Signed)

## 2013-12-09 LAB — CMP14+EGFR
ALBUMIN: 3.9 g/dL (ref 3.5–5.5)
ALT: 13 IU/L (ref 0–32)
AST: 16 IU/L (ref 0–40)
Albumin/Globulin Ratio: 1.6 (ref 1.1–2.5)
Alkaline Phosphatase: 58 IU/L (ref 39–117)
BUN/Creatinine Ratio: 9 (ref 8–20)
BUN: 8 mg/dL (ref 6–20)
CALCIUM: 9.1 mg/dL (ref 8.7–10.2)
CHLORIDE: 103 mmol/L (ref 97–108)
CO2: 21 mmol/L (ref 18–29)
Creatinine, Ser: 0.88 mg/dL (ref 0.57–1.00)
GFR calc non Af Amer: 84 mL/min/{1.73_m2} (ref >59)
GFR, EST AFRICAN AMERICAN: 96 mL/min/{1.73_m2} (ref >59)
GLUCOSE: 86 mg/dL (ref 65–99)
Globulin, Total: 2.5 g/dL (ref 1.5–4.5)
Potassium: 4.3 mmol/L (ref 3.5–5.2)
Sodium: 138 mmol/L (ref 134–144)
TOTAL PROTEIN: 6.4 g/dL (ref 6.0–8.5)
Total Bilirubin: 0.5 mg/dL (ref 0.0–1.2)

## 2014-03-24 ENCOUNTER — Ambulatory Visit (INDEPENDENT_AMBULATORY_CARE_PROVIDER_SITE_OTHER): Payer: 59 | Admitting: Family Medicine

## 2014-03-24 ENCOUNTER — Encounter: Payer: Self-pay | Admitting: Family Medicine

## 2014-03-24 VITALS — BP 106/73 | HR 72 | Temp 98.2°F | Ht 66.0 in | Wt 122.0 lb

## 2014-03-24 DIAGNOSIS — M797 Fibromyalgia: Secondary | ICD-10-CM

## 2014-03-24 DIAGNOSIS — F32A Depression, unspecified: Secondary | ICD-10-CM

## 2014-03-24 DIAGNOSIS — Z800 Family history of malignant neoplasm of digestive organs: Secondary | ICD-10-CM

## 2014-03-24 DIAGNOSIS — F329 Major depressive disorder, single episode, unspecified: Secondary | ICD-10-CM

## 2014-03-24 DIAGNOSIS — F411 Generalized anxiety disorder: Secondary | ICD-10-CM

## 2014-03-24 LAB — POCT CBC
GRANULOCYTE PERCENT: 63.9 % (ref 37–80)
HCT, POC: 39.4 % (ref 37.7–47.9)
Hemoglobin: 12.9 g/dL (ref 12.2–16.2)
LYMPH, POC: 1.9 (ref 0.6–3.4)
MCH, POC: 28.5 pg (ref 27–31.2)
MCHC: 32.7 g/dL (ref 31.8–35.4)
MCV: 87.4 fL (ref 80–97)
MPV: 7.8 fL (ref 0–99.8)
PLATELET COUNT, POC: 262 10*3/uL (ref 142–424)
POC GRANULOCYTE: 4 (ref 2–6.9)
POC LYMPH %: 31 % (ref 10–50)
RBC: 4.5 M/uL (ref 4.04–5.48)
RDW, POC: 12.7 %
WBC: 6.2 10*3/uL (ref 4.6–10.2)

## 2014-03-24 MED ORDER — DULOXETINE HCL 30 MG PO CPEP: 30.0000 mg | ORAL_CAPSULE | Freq: Every day | ORAL | Status: DC

## 2014-03-24 MED ORDER — ALPRAZOLAM 0.5 MG PO TABS: ORAL_TABLET | ORAL | Status: DC

## 2014-03-24 NOTE — Progress Notes (Signed)
Subjective:    Patient ID: Kristen Mullins, female    DOB: July 26, 1975, 38 y.o.   MRN: [id number]  HPI Patient here today for anxiety. She states it is worse than it has been in the past. The patient is doing with a lot of stress in her life. Her father has a history of lung cancer and he is 39 years old and this has returned. Her paternal aunt has pancreatic cancer. 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 Please see the pH Q9 she had a score of 12 out of 27.        Patient Active Problem List   Diagnosis Date Noted  . GAD (generalized anxiety disorder) 12/08/2013  . Generalized anxiety disorder 12/08/2013   Outpatient Encounter Prescriptions as of 03/24/2014  Medication Sig  . acetaminophen (TYLENOL) 500 MG tablet Take 1,000 mg by mouth every 4 (four) hours as needed for mild pain, moderate pain or headache.  . ALPRAZolam (XANAX) 0.5 MG tablet Take 1 tablet (0.5 mg total) by mouth at bedtime.  Marland Kitchen ibuprofen (ADVIL,MOTRIN) 600 MG tablet Take 1 tablet (600 mg total) by mouth every 6 (six) hours as needed for moderate pain.  Marland Kitchen loratadine (CLARITIN) 10 MG tablet Take 10 mg by mouth daily.  Donnetta Hail Estradiol (SPRINTEC 28 PO) Take by mouth.  . [DISCONTINUED] CAMRESE 0.15-0.03 &0.01 MG tablet     Review of Systems  Constitutional: Negative.   HENT: Negative.   Eyes: Negative.   Respiratory: Negative.   Cardiovascular: Negative.   Gastrointestinal: Negative.   Endocrine: Negative.   Genitourinary: Negative.   Musculoskeletal: Negative.   Skin: Negative.   Allergic/Immunologic: Negative.   Neurological: Negative.   Hematological: Negative.   Psychiatric/Behavioral: The patient is nervous/anxious.        Objective:   Physical Exam  Constitutional: She is oriented to person, place, and time. No distress.  Then soft spoken and somewhat different depressed appearing  HENT:  Head: Atraumatic.  Right Ear: External ear normal.  Left Ear:  External ear normal.  Nose: Nose normal.  Mouth/Throat: Oropharynx is clear and moist.  Eyes: Conjunctivae and EOM are normal. Pupils are equal, round, and reactive to light. Right eye exhibits no discharge. Left eye exhibits no discharge. No scleral icterus.  Neck: Normal range of motion. Neck supple. No thyromegaly present.  No thyromegaly  Cardiovascular: Normal rate, regular rhythm, normal heart sounds and intact distal pulses.   No murmur heard. Pulmonary/Chest: Effort normal and breath sounds normal. No respiratory distress. She has no wheezes. She has no rales. She exhibits no tenderness.  Abdominal: Soft. Bowel sounds are normal. She exhibits no mass. There is no tenderness. There is no rebound and no guarding.  Musculoskeletal: Normal range of motion. She exhibits no edema or tenderness.  Lymphadenopathy:    She has no cervical adenopathy.  Neurological: She is alert and oriented to person, place, and time. She has normal reflexes.  Skin: Skin is warm and dry. No rash noted.  Psychiatric: She has a normal mood and affect. Her behavior is normal. Judgment and thought content normal.  Nursing note and vitals reviewed.  BP 106/73 mmHg  Pulse 72  Temp(Src) 98.2 F (36.8 C) (Oral)  Ht 5\' 6"  (1.676 m)  Wt 122 lb (55.339 kg)  BMI 19.70 kg/m2  LMP 03/17/2014        Assessment & Plan:   1. Generalized anxiety disorder - POCT CBC - Thyroid Panel With TSH -  Vitamin B12 - Folate - ALPRAZolam (XANAX) 0.5 MG tablet; Take 1 tab QHS and additional 1/2 tab daily as needed  Dispense: 45 tablet; Refill: 2  2. Depressive state - POCT CBC - Thyroid Panel With TSH - Vitamin B12 - Folate  3. Fibromyalgia  4. Family history of colon cancer  Meds ordered this encounter  Medications  . Norgestimate-Eth Estradiol (SPRINTEC 28 PO)    Sig: Take by mouth.  . DULoxetine (CYMBALTA) 30 MG capsule    Sig: Take 1 capsule (30 mg total) by mouth daily.    Dispense:  30 capsule    Refill:   3  . ALPRAZolam (XANAX) 0.5 MG tablet    Sig: Take 1 tab QHS and additional 1/2 tab daily as needed    Dispense:  45 tablet    Refill:  2   Patient Instructions  Take medication as directed Avoid caffeine Take Cymbalta with a little bit of food to reduce the nausea that may occur for the first 2 or 3 doses Continue to take the Xanax at bedtime as needed and only a half and one during the day   Arrie Senate MD

## 2014-03-24 NOTE — Patient Instructions (Signed)
Take medication as directed Avoid caffeine Take Cymbalta with a little bit of food to reduce the nausea that may occur for the first 2 or 3 doses Continue to take the Xanax at bedtime as needed and only a half and one during the day

## 2014-03-25 LAB — THYROID PANEL WITH TSH
Free Thyroxine Index: 2.2 (ref 1.2–4.9)
T3 Uptake Ratio: 21 % — ABNORMAL LOW (ref 24–39)
T4 TOTAL: 10.3 ug/dL (ref 4.5–12.0)
TSH: 1 u[IU]/mL (ref 0.450–4.500)

## 2014-03-25 LAB — FOLATE: Folate: 11.2 ng/mL (ref >3.0)

## 2014-03-25 LAB — VITAMIN B12: Vitamin B-12: 396 pg/mL (ref 211–946)

## 2014-03-26 ENCOUNTER — Other Ambulatory Visit: Payer: 59

## 2014-03-26 DIAGNOSIS — Z1212 Encounter for screening for malignant neoplasm of rectum: Secondary | ICD-10-CM

## 2014-03-26 NOTE — Progress Notes (Signed)
Lab only 

## 2014-03-28 LAB — FECAL OCCULT BLOOD, IMMUNOCHEMICAL: Fecal Occult Bld: NEGATIVE

## 2014-04-23 ENCOUNTER — Ambulatory Visit: Payer: 59 | Admitting: Family Medicine

## 2014-08-14 ENCOUNTER — Ambulatory Visit (INDEPENDENT_AMBULATORY_CARE_PROVIDER_SITE_OTHER): Payer: 59 | Admitting: Family

## 2014-08-14 ENCOUNTER — Encounter: Payer: Self-pay | Admitting: Family

## 2014-08-14 VITALS — BP 106/75 | HR 82 | Temp 97.2°F | Ht 66.0 in | Wt 117.6 lb

## 2014-08-14 DIAGNOSIS — F411 Generalized anxiety disorder: Secondary | ICD-10-CM | POA: Diagnosis not present

## 2014-08-14 MED ORDER — ALPRAZOLAM 0.5 MG PO TABS: ORAL_TABLET | ORAL | Status: DC

## 2014-08-14 NOTE — Progress Notes (Signed)
   Subjective:    Patient ID: Kristen Mullins, female    DOB: May 07, 1976, 39 y.o.   MRN: [id number]  Anxiety Onset was 1 to 6 months ago. The problem has been waxing and waning. Symptoms include depressed mood, excessive worry and nervous/anxious behavior. Patient reports no chest pain, insomnia, irritability, muscle tension, palpitations, panic, restlessness or shortness of breath. Symptoms occur occasionally. The symptoms are aggravated by family issues (Father died).   Her past medical history is significant for anxiety/panic attacks and depression. Past treatments include benzodiazephines. Compliance with prior treatments has been good.      Review of Systems  Constitutional: Negative.  Negative for irritability.  HENT: Negative.   Eyes: Negative.   Respiratory: Negative.  Negative for shortness of breath.   Cardiovascular: Negative.  Negative for chest pain and palpitations.  Gastrointestinal: Negative.   Endocrine: Negative.   Genitourinary: Negative.   Musculoskeletal: Negative.   Neurological: Negative.  Negative for headaches.  Hematological: Negative.   Psychiatric/Behavioral: The patient is nervous/anxious. The patient does not have insomnia.   All other systems reviewed and are negative.      Objective:   Physical Exam  Constitutional: She is oriented to person, place, and time. She appears well-developed and well-nourished. No distress.  HENT:  Head: Normocephalic and atraumatic.  Right Ear: External ear normal.  Left Ear: External ear normal.  Nose: Nose normal.  Mouth/Throat: Oropharynx is clear and moist.  Eyes: Pupils are equal, round, and reactive to light.  Neck: Normal range of motion. Neck supple. No thyromegaly present.  Cardiovascular: Normal rate, regular rhythm, normal heart sounds and intact distal pulses.   No murmur heard. Pulmonary/Chest: Effort normal and breath sounds normal. No respiratory distress. She has no wheezes.  Abdominal: Soft. Bowel  sounds are normal. She exhibits no distension. There is no tenderness.  Musculoskeletal: Normal range of motion. She exhibits no edema or tenderness.  Neurological: She is alert and oriented to person, place, and time. She has normal reflexes. No cranial nerve deficit.  Skin: Skin is warm and dry.  Psychiatric: She has a normal mood and affect. Her behavior is normal. Judgment and thought content normal.  Vitals reviewed.     BP 106/75 mmHg  Pulse 82  Temp(Src) 97.2 F (36.2 C) (Oral)  Ht 5\' 6"  (1.676 m)  Wt 117 lb 9.6 oz (53.343 kg)  BMI 18.99 kg/m2     Assessment & Plan:  1. Generalized anxiety disorder -Stress management discussed -Pt had complete blood work drawn within last 8 months -RTO in 1 year  - ALPRAZolam (XANAX) 0.5 MG tablet; Take 1 tab QHS and additional 1/2 tab daily as needed  Dispense: 45 tablet; Refill: [place], FNP

## 2014-08-14 NOTE — Patient Instructions (Signed)
Generalized Anxiety Disorder Generalized anxiety disorder (GAD) is a mental disorder. It interferes with life functions, including relationships, work, and school. GAD is different from normal anxiety, which everyone experiences at some point in their lives in response to specific life events and activities. Normal anxiety actually helps [place] prepare for and get through these life events and activities. Normal anxiety goes away after the event or activity is over.  GAD causes anxiety that is not necessarily related to specific events or activities. It also causes excess anxiety in proportion to specific events or activities. The anxiety associated with GAD is also difficult to control. GAD can vary from mild to severe. People with severe GAD can have intense waves of anxiety with physical symptoms (panic attacks).  SYMPTOMS The anxiety and worry associated with GAD are difficult to control. This anxiety and worry are related to many life events and activities and also occur more days than not for 6 months or longer. People with GAD also have three or more of the following symptoms (one or more in children):  Restlessness.   Fatigue.  Difficulty concentrating.   Irritability.  Muscle tension.  Difficulty sleeping or unsatisfying sleep. DIAGNOSIS GAD is diagnosed through an assessment by your health care provider. Your health care provider will ask you questions aboutyour mood,physical symptoms, and events in your life. Your health care provider may ask you about your medical history and use of alcohol or drugs, including prescription medicines. Your health care provider may also do a physical exam and blood tests. Certain medical conditions and the use of certain substances can cause symptoms similar to those associated with GAD. Your health care provider may refer you to a mental health specialist for further evaluation. TREATMENT The following therapies are usually used to treat GAD:    Medication. Antidepressant medication usually is prescribed for long-term daily control. Antianxiety medicines may be added in severe cases, especially when panic attacks occur.   Talk therapy (psychotherapy). Certain types of talk therapy can be helpful in treating GAD by providing support, education, and guidance. A form of talk therapy called cognitive behavioral therapy can teach you healthy ways to think about and react to daily life events and activities.  Stress managementtechniques. These include yoga, meditation, and exercise and can be very helpful when they are practiced regularly. A mental health specialist can help determine which treatment is best for you. Some people see improvement with one therapy. However, other people require a combination of therapies. Document Released: 08/19/2012 Document Revised: 09/08/2013 Document Reviewed: 08/19/2012 [facility] Patient Information 2015 [place], [place]. This information is not intended to replace advice given to you by your health care provider. Make sure you discuss any questions you have with your health care provider. Health Maintenance Adopting a healthy lifestyle and getting preventive care can go a long way to promote health and wellness. Talk with your health care provider about what schedule of regular examinations is right for you. This is a good chance for you to check in with your provider about disease prevention and staying healthy. In between checkups, there are plenty of things you can do on your own. Experts have done a lot of research about which lifestyle changes and preventive measures are most likely to keep you healthy. Ask your health care provider for more information. WEIGHT AND DIET  Eat a healthy diet  Be sure to include plenty of vegetables, fruits, low-fat dairy products, and lean protein.  Do not eat a lot of foods high  in solid fats, added sugars, or salt.  Get regular exercise. This is one of the most  important things you can do for your health.  Most adults should exercise for at least 150 minutes each week. The exercise should increase your heart rate and make you sweat (moderate-intensity exercise).  Most adults should also do strengthening exercises at least twice a week. This is in addition to the moderate-intensity exercise.  Maintain a healthy weight  Body mass index (BMI) is a measurement that can be used to identify possible weight problems. It estimates body fat based on height and weight. Your health care provider can help determine your BMI and help you achieve or maintain a healthy weight.  For females 91 years of age and older:   A BMI below 18.5 is considered underweight.  A BMI of 18.5 to 24.9 is normal.  A BMI of 25 to 29.9 is considered overweight.  A BMI of 30 and above is considered obese.  Watch levels of cholesterol and blood lipids  You should start having your blood tested for lipids and cholesterol at 40 years of age, then have this test every 5 years.  You may need to have your cholesterol levels checked more often if:  Your lipid or cholesterol levels are high.  You are older than 40 years of age.  You are at high risk for heart disease.  CANCER SCREENING   Lung Cancer  Lung cancer screening is recommended for adults 37-100 years old who are at high risk for lung cancer because of a history of smoking.  A yearly low-dose CT scan of the lungs is recommended for people who:  Currently smoke.  Have quit within the past 15 years.  Have at least a 30-pack-year history of smoking. A pack year is smoking an average of one pack of cigarettes a day for 1 year.  Yearly screening should continue until it has been 15 years since you quit.  Yearly screening should stop if you develop a health problem that would prevent you from having lung cancer treatment.  Breast Cancer  Practice breast self-awareness. This means understanding how your breasts  normally appear and feel.  It also means doing regular breast self-exams. Let your health care provider know about any changes, no matter how small.  If you are in your 20s or 30s, you should have a clinical breast exam (CBE) by a health care provider every 1-3 years as part of a regular health exam.  If you are 55 or older, have a CBE every year. Also consider having a breast X-ray (mammogram) every year.  If you have a family history of breast cancer, talk to your health care provider about genetic screening.  If you are at high risk for breast cancer, talk to your health care provider about having an MRI and a mammogram every year.  Breast cancer gene (BRCA) assessment is recommended for women who have family members with BRCA-related cancers. BRCA-related cancers include:  Breast.  Ovarian.  Tubal.  Peritoneal cancers.  Results of the assessment will determine the need for genetic counseling and BRCA1 and BRCA2 testing. Cervical Cancer Routine pelvic examinations to screen for cervical cancer are no longer recommended for nonpregnant women who are considered low risk for cancer of the pelvic organs (ovaries, uterus, and vagina) and who do not have symptoms. A pelvic examination may be necessary if you have symptoms including those associated with pelvic infections. Ask your health care provider if a screening  pelvic exam is right for you.   The Pap test is the screening test for cervical cancer for women who are considered at risk.  If you had a hysterectomy for a problem that was not cancer or a condition that could lead to cancer, then you no longer need Pap tests.  If you are older than 65 years, and you have had normal Pap tests for the past 10 years, you no longer need to have Pap tests.  If you have had past treatment for cervical cancer or a condition that could lead to cancer, you need Pap tests and screening for cancer for at least 20 years after your treatment.  If you  no longer get a Pap test, assess your risk factors if they change (such as having a new sexual partner). This can affect whether you should start being screened again.  Some women have medical problems that increase their chance of getting cervical cancer. If this is the case for you, your health care provider may recommend more frequent screening and Pap tests.  The human papillomavirus (HPV) test is another test that may be used for cervical cancer screening. The HPV test looks for the virus that can cause cell changes in the cervix. The cells collected during the Pap test can be tested for HPV.  The HPV test can be used to screen women 86 years of age and older. Getting tested for HPV can extend the interval between normal Pap tests from three to five years.  An HPV test also should be used to screen women of any age who have unclear Pap test results.  After 40 years of age, women should have HPV testing as often as Pap tests.  Colorectal Cancer  This type of cancer can be detected and often prevented.  Routine colorectal cancer screening usually begins at 40 years of age and continues through 40 years of age.  Your health care provider may recommend screening at an earlier age if you have risk factors for colon cancer.  Your health care provider may also recommend using home test kits to check for hidden blood in the stool.  A small camera at the end of a tube can be used to examine your colon directly (sigmoidoscopy or colonoscopy). This is done to check for the earliest forms of colorectal cancer.  Routine screening usually begins at age 69.  Direct examination of the colon should be repeated every 5-10 years through 40 years of age. However, you may need to be screened more often if early forms of precancerous polyps or small growths are found. Skin Cancer  Check your skin from head to toe regularly.  Tell your health care provider about any new moles or changes in moles,  especially if there is a change in a mole's shape or color.  Also tell your health care provider if you have a mole that is larger than the size of a pencil eraser.  Always use sunscreen. Apply sunscreen liberally and repeatedly throughout the day.  Protect yourself by wearing long sleeves, pants, a wide-brimmed hat, and sunglasses whenever you are outside. HEART DISEASE, DIABETES, AND HIGH BLOOD PRESSURE   Have your blood pressure checked at least every 1-2 years. High blood pressure causes heart disease and increases the risk of stroke.  If you are between 10 years and 108 years old, ask your health care provider if you should take aspirin to prevent strokes.  Have regular diabetes screenings. This involves taking a blood  sample to check your fasting blood sugar level.  If you are at a normal weight and have a low risk for diabetes, have this test once every three years after 40 years of age.  If you are overweight and have a high risk for diabetes, consider being tested at a younger age or more often. PREVENTING INFECTION  Hepatitis B  If you have a higher risk for hepatitis B, you should be screened for this virus. You are considered at high risk for hepatitis B if:  You were born in a country where hepatitis B is common. Ask your health care provider which countries are considered high risk.  Your parents were born in a high-risk country, and you have not been immunized against hepatitis B (hepatitis B vaccine).  You have HIV or AIDS.  You use needles to inject street drugs.  You live with someone who has hepatitis B.  You have had sex with someone who has hepatitis B.  You get hemodialysis treatment.  You take certain medicines for conditions, including cancer, organ transplantation, and autoimmune conditions. Hepatitis C  Blood testing is recommended for:  Everyone born from [birth year] through 1966.  Anyone with known risk factors for hepatitis C. Sexually transmitted  infections (STIs)  You should be screened for sexually transmitted infections (STIs) including gonorrhea and chlamydia if:  You are sexually active and are younger than 40 years of age.  You are older than 40 years of age and your health care provider tells you that you are at risk for this type of infection.  Your sexual activity has changed since you were last screened and you are at an increased risk for chlamydia or gonorrhea. Ask your health care provider if you are at risk.  If you do not have HIV, but are at risk, it may be recommended that you take a prescription medicine daily to prevent HIV infection. This is called pre-exposure prophylaxis (PrEP). You are considered at risk if:  You are sexually active and do not regularly use condoms or know the HIV status of your partner(s).  You take drugs by injection.  You are sexually active with a partner who has HIV. Talk with your health care provider about whether you are at high risk of being infected with HIV. If you choose to begin PrEP, you should first be tested for HIV. You should then be tested every 3 months for as long as you are taking PrEP.  PREGNANCY   If you are premenopausal and you may become pregnant, ask your health care provider about preconception counseling.  If you may become pregnant, take 400 to 800 micrograms (mcg) of folic acid every day.  If you want to prevent pregnancy, talk to your health care provider about birth control (contraception). OSTEOPOROSIS AND MENOPAUSE   Osteoporosis is a disease in which the bones lose minerals and strength with aging. This can result in serious bone fractures. Your risk for osteoporosis can be identified using a bone density scan.  If you are 52 years of age or older, or if you are at risk for osteoporosis and fractures, ask your health care provider if you should be screened.  Ask your health care provider whether you should take a calcium or vitamin D supplement to  lower your risk for osteoporosis.  Menopause may have certain physical symptoms and risks.  Hormone replacement therapy may reduce some of these symptoms and risks. Talk to your health care provider about whether hormone replacement therapy  is right for you.  HOME CARE INSTRUCTIONS   Schedule regular health, dental, and eye exams.  Stay current with your immunizations.   Do not use any tobacco products including cigarettes, chewing tobacco, or electronic cigarettes.  If you are pregnant, do not drink alcohol.  If you are breastfeeding, limit how much and how often you drink alcohol.  Limit alcohol intake to no more than 1 drink per day for nonpregnant women. One drink equals 12 ounces of beer, 5 ounces of wine, or 1 ounces of hard liquor.  Do not use street drugs.  Do not share needles.  Ask your health care provider for help if you need support or information about quitting drugs.  Tell your health care provider if you often feel depressed.  Tell your health care provider if you have ever been abused or do not feel safe at home. Document Released: 11/07/2010 Document Revised: 09/08/2013 Document Reviewed: 03/26/2013 [facility] Patient Information 2015 [place], [place]. This information is not intended to replace advice given to you by your health care provider. Make sure you discuss any questions you have with your health care provider.

## 2014-09-24 ENCOUNTER — Other Ambulatory Visit: Payer: Self-pay | Admitting: Obstetrics and Gynecology

## 2014-09-25 LAB — CYTOLOGY - PAP

## 2015-03-08 ENCOUNTER — Encounter: Payer: Self-pay | Admitting: Family

## 2015-03-08 ENCOUNTER — Ambulatory Visit (INDEPENDENT_AMBULATORY_CARE_PROVIDER_SITE_OTHER): Payer: 59 | Admitting: Family

## 2015-03-08 VITALS — BP 111/76 | HR 84 | Temp 98.9°F | Resp 16 | Ht 66.0 in | Wt 118.0 lb

## 2015-03-08 DIAGNOSIS — F411 Generalized anxiety disorder: Secondary | ICD-10-CM

## 2015-03-08 MED ORDER — ALPRAZOLAM 0.5 MG PO TABS: ORAL_TABLET | ORAL | Status: DC

## 2015-03-08 NOTE — Patient Instructions (Signed)
Health Maintenance, Female Adopting a healthy lifestyle and getting preventive care can go a long way to promote health and wellness. Talk with your health care provider about what schedule of regular examinations is right for you. This is a good chance for you to check in with your provider about disease prevention and staying healthy. In between checkups, there are plenty of things you can do on your own. Experts have done a lot of research about which lifestyle changes and preventive measures are most likely to keep you healthy. Ask your health care provider for more information. WEIGHT AND DIET  Eat a healthy diet  Be sure to include plenty of vegetables, fruits, low-fat dairy products, and lean protein.  Do not eat a lot of foods high in solid fats, added sugars, or salt.  Get regular exercise. This is one of the most important things you can do for your health.  Most adults should exercise for at least 150 minutes each week. The exercise should increase your heart rate and make you sweat (moderate-intensity exercise).  Most adults should also do strengthening exercises at least twice a week. This is in addition to the moderate-intensity exercise.  Maintain a healthy weight  Body mass index (BMI) is a measurement that can be used to identify possible weight problems. It estimates body fat based on height and weight. Your health care provider can help determine your BMI and help you achieve or maintain a healthy weight.  For females 20 years of age and older:   A BMI below 18.5 is considered underweight.  A BMI of 18.5 to 24.9 is normal.  A BMI of 25 to 29.9 is considered overweight.  A BMI of 30 and above is considered obese.  Watch levels of cholesterol and blood lipids  You should start having your blood tested for lipids and cholesterol at 40 years of age, then have this test every 5 years.  You may need to have your cholesterol levels checked more often if:  Your lipid  or cholesterol levels are high.  You are older than 40 years of age.  You are at high risk for heart disease.  CANCER SCREENING   Lung Cancer  Lung cancer screening is recommended for adults 55-80 years old who are at high risk for lung cancer because of a history of smoking.  A yearly low-dose CT scan of the lungs is recommended for people who:  Currently smoke.  Have quit within the past 15 years.  Have at least a 30-pack-year history of smoking. A pack year is smoking an average of one pack of cigarettes a day for 1 year.  Yearly screening should continue until it has been 15 years since you quit.  Yearly screening should stop if you develop a health problem that would prevent you from having lung cancer treatment.  Breast Cancer  Practice breast self-awareness. This means understanding how your breasts normally appear and feel.  It also means doing regular breast self-exams. Let your health care provider know about any changes, no matter how small.  If you are in your 20s or 30s, you should have a clinical breast exam (CBE) by a health care provider every 1-3 years as part of a regular health exam.  If you are 40 or older, have a CBE every year. Also consider having a breast X-ray (mammogram) every year.  If you have a family history of breast cancer, talk to your health care provider about genetic screening.  If you   are at high risk for breast cancer, talk to your health care provider about having an MRI and a mammogram every year.  Breast cancer gene (BRCA) assessment is recommended for women who have family members with BRCA-related cancers. BRCA-related cancers include:  Breast.  Ovarian.  Tubal.  Peritoneal cancers.  Results of the assessment will determine the need for genetic counseling and BRCA1 and BRCA2 testing. Cervical Cancer Your health care provider may recommend that you be screened regularly for cancer of the pelvic organs (ovaries, uterus, and  vagina). This screening involves a pelvic examination, including checking for microscopic changes to the surface of your cervix (Pap test). You may be encouraged to have this screening done every 3 years, beginning at age 21.  For women ages 30-65, health care providers may recommend pelvic exams and Pap testing every 3 years, or they may recommend the Pap and pelvic exam, combined with testing for human papilloma virus (HPV), every 5 years. Some types of HPV increase your risk of cervical cancer. Testing for HPV may also be done on women of any age with unclear Pap test results.  Other health care providers may not recommend any screening for nonpregnant women who are considered low risk for pelvic cancer and who do not have symptoms. Ask your health care provider if a screening pelvic exam is right for you.  If you have had past treatment for cervical cancer or a condition that could lead to cancer, you need Pap tests and screening for cancer for at least 20 years after your treatment. If Pap tests have been discontinued, your risk factors (such as having a new sexual partner) need to be reassessed to determine if screening should resume. Some women have medical problems that increase the chance of getting cervical cancer. In these cases, your health care provider may recommend more frequent screening and Pap tests. Colorectal Cancer  This type of cancer can be detected and often prevented.  Routine colorectal cancer screening usually begins at 40 years of age and continues through 40 years of age.  Your health care provider may recommend screening at an earlier age if you have risk factors for colon cancer.  Your health care provider may also recommend using home test kits to check for hidden blood in the stool.  A small camera at the end of a tube can be used to examine your colon directly (sigmoidoscopy or colonoscopy). This is done to check for the earliest forms of colorectal  cancer.  Routine screening usually begins at age 50.  Direct examination of the colon should be repeated every 5-10 years through 40 years of age. However, you may need to be screened more often if early forms of precancerous polyps or small growths are found. Skin Cancer  Check your skin from head to toe regularly.  Tell your health care provider about any new moles or changes in moles, especially if there is a change in a mole's shape or color.  Also tell your health care provider if you have a mole that is larger than the size of a pencil eraser.  Always use sunscreen. Apply sunscreen liberally and repeatedly throughout the day.  Protect yourself by wearing long sleeves, pants, a wide-brimmed hat, and sunglasses whenever you are outside. HEART DISEASE, DIABETES, AND HIGH BLOOD PRESSURE   High blood pressure causes heart disease and increases the risk of stroke. High blood pressure is more likely to develop in:  People who have blood pressure in the high end   of the normal range (130-139/85-89 mm Hg).  People who are overweight or obese.  People who are African American.  If you are 38-23 years of age, have your blood pressure checked every 3-5 years. If you are 61 years of age or older, have your blood pressure checked every year. You should have your blood pressure measured twice--once when you are at a hospital or clinic, and once when you are not at a hospital or clinic. Record the average of the two measurements. To check your blood pressure when you are not at a hospital or clinic, you can use:  An automated blood pressure machine at a pharmacy.  A home blood pressure monitor.  If you are between 45 years and 39 years old, ask your health care provider if you should take aspirin to prevent strokes.  Have regular diabetes screenings. This involves taking a blood sample to check your fasting blood sugar level.  If you are at a normal weight and have a low risk for diabetes,  have this test once every three years after 40 years of age.  If you are overweight and have a high risk for diabetes, consider being tested at a younger age or more often. PREVENTING INFECTION  Hepatitis B  If you have a higher risk for hepatitis B, you should be screened for this virus. You are considered at high risk for hepatitis B if:  You were born in a country where hepatitis B is common. Ask your health care provider which countries are considered high risk.  Your parents were born in a high-risk country, and you have not been immunized against hepatitis B (hepatitis B vaccine).  You have HIV or AIDS.  You use needles to inject street drugs.  You live with someone who has hepatitis B.  You have had sex with someone who has hepatitis B.  You get hemodialysis treatment.  You take certain medicines for conditions, including cancer, organ transplantation, and autoimmune conditions. Hepatitis C  Blood testing is recommended for:  Everyone born from [birth year] through 1966.  Anyone with known risk factors for hepatitis C. Sexually transmitted infections (STIs)  You should be screened for sexually transmitted infections (STIs) including gonorrhea and chlamydia if:  You are sexually active and are younger than 40 years of age.  You are older than 40 years of age and your health care provider tells you that you are at risk for this type of infection.  Your sexual activity has changed since you were last screened and you are at an increased risk for chlamydia or gonorrhea. Ask your health care provider if you are at risk.  If you do not have HIV, but are at risk, it may be recommended that you take a prescription medicine daily to prevent HIV infection. This is called pre-exposure prophylaxis (PrEP). You are considered at risk if:  You are sexually active and do not regularly use condoms or know the HIV status of your partner(s).  You take drugs by injection.  You are sexually  active with a partner who has HIV. Talk with your health care provider about whether you are at high risk of being infected with HIV. If you choose to begin PrEP, you should first be tested for HIV. You should then be tested every 3 months for as long as you are taking PrEP.  PREGNANCY   If you are premenopausal and you may become pregnant, ask your health care provider about preconception counseling.  If you may  become pregnant, take 400 to 800 micrograms (mcg) of folic acid every day.  If you want to prevent pregnancy, talk to your health care provider about birth control (contraception). OSTEOPOROSIS AND MENOPAUSE   Osteoporosis is a disease in which the bones lose minerals and strength with aging. This can result in serious bone fractures. Your risk for osteoporosis can be identified using a bone density scan.  If you are 61 years of age or older, or if you are at risk for osteoporosis and fractures, ask your health care provider if you should be screened.  Ask your health care provider whether you should take a calcium or vitamin D supplement to lower your risk for osteoporosis.  Menopause may have certain physical symptoms and risks.  Hormone replacement therapy may reduce some of these symptoms and risks. Talk to your health care provider about whether hormone replacement therapy is right for you.  HOME CARE INSTRUCTIONS   Schedule regular health, dental, and eye exams.  Stay current with your immunizations.   Do not use any tobacco products including cigarettes, chewing tobacco, or electronic cigarettes.  If you are pregnant, do not drink alcohol.  If you are breastfeeding, limit how much and how often you drink alcohol.  Limit alcohol intake to no more than 1 drink per day for nonpregnant women. One drink equals 12 ounces of beer, 5 ounces of wine, or 1 ounces of hard liquor.  Do not use street drugs.  Do not share needles.  Ask your health care provider for help if  you need support or information about quitting drugs.  Tell your health care provider if you often feel depressed.  Tell your health care provider if you have ever been abused or do not feel safe at home.   This information is not intended to replace advice given to you by your health care provider. Make sure you discuss any questions you have with your health care provider.   Document Released: 11/07/2010 Document Revised: 05/15/2014 Document Reviewed: 03/26/2013 Elsevier Interactive Patient Education [employer].

## 2015-03-08 NOTE — Progress Notes (Signed)
   Subjective:    Patient ID: Kristen Mullins, female    DOB: [birth year], 39 y.o.   MRN: [id number]  Pt presents to the office today for chronic follow up.  Anxiety Presents for follow-up visit. Onset was 1 to 6 months ago. The problem has been waxing and waning. Symptoms include depressed mood, excessive worry, insomnia and nervous/anxious behavior. Patient reports no chest pain, irritability, muscle tension, palpitations, panic, restlessness or shortness of breath. Symptoms occur occasionally. The symptoms are aggravated by family issues (Father died).   Her past medical history is significant for anxiety/panic attacks and depression. Past treatments include benzodiazephines. Compliance with prior treatments has been good.      Review of Systems  Constitutional: Negative.  Negative for irritability.  HENT: Negative.   Eyes: Negative.   Respiratory: Negative.  Negative for shortness of breath.   Cardiovascular: Negative.  Negative for chest pain and palpitations.  Gastrointestinal: Negative.   Endocrine: Negative.   Genitourinary: Negative.   Musculoskeletal: Negative.   Neurological: Negative.  Negative for headaches.  Hematological: Negative.   Psychiatric/Behavioral: The patient is nervous/anxious and has insomnia.   All other systems reviewed and are negative.      Objective:   Physical Exam  Constitutional: She is oriented to person, place, and time. She appears well-developed and well-nourished. No distress.  HENT:  Head: Normocephalic and atraumatic.  Right Ear: External ear normal.  Left Ear: External ear normal.  Nose: Nose normal.  Mouth/Throat: Oropharynx is clear and moist.  Eyes: Pupils are equal, round, and reactive to light.  Neck: Normal range of motion. Neck supple. No thyromegaly present.  Cardiovascular: Normal rate, regular rhythm, normal heart sounds and intact distal pulses.   No murmur heard. Pulmonary/Chest: Effort normal and breath sounds normal. No  respiratory distress. She has no wheezes.  Abdominal: Soft. Bowel sounds are normal. She exhibits no distension. There is no tenderness.  Musculoskeletal: Normal range of motion. She exhibits no edema or tenderness.  Neurological: She is alert and oriented to person, place, and time. She has normal reflexes. No cranial nerve deficit.  Skin: Skin is warm and dry.  Psychiatric: She has a normal mood and affect. Her behavior is normal. Judgment and thought content normal.  Vitals reviewed.   BP 111/76 mmHg  Pulse 84  Temp(Src) 98.9 F (37.2 C) (Oral)  Resp 16  Ht $R'5\' 6"'dJ$  (1.676 m)  Wt 118 lb (53.524 kg)  BMI 19.05 kg/m2       Assessment & Plan:  1. Generalized anxiety disorder - CMP14+EGFR - ALPRAZolam (XANAX) 0.5 MG tablet; Take 1 tab QHS and additional 1/2 tab daily as needed  Dispense: 45 tablet; Refill: 4   Continue all meds Labs pending Health Maintenance reviewed Diet and exercise encouraged RTO 6 months  Evelina Dun, FNP

## 2015-03-09 LAB — CMP14+EGFR
ALK PHOS: 63 IU/L (ref 39–117)
ALT: 10 IU/L (ref 0–32)
AST: 14 IU/L (ref 0–40)
Albumin/Globulin Ratio: 1.7 (ref 1.1–2.5)
Albumin: 4.3 g/dL (ref 3.5–5.5)
BILIRUBIN TOTAL: 0.3 mg/dL (ref 0.0–1.2)
BUN/Creatinine Ratio: 10 (ref 8–20)
BUN: 10 mg/dL (ref 6–20)
CALCIUM: 9.1 mg/dL (ref 8.7–10.2)
CHLORIDE: 103 mmol/L (ref 97–106)
CO2: 25 mmol/L (ref 18–29)
Creatinine, Ser: 0.99 mg/dL (ref 0.57–1.00)
GFR calc Af Amer: 83 mL/min/{1.73_m2} (ref >59)
GFR calc non Af Amer: 72 mL/min/{1.73_m2} (ref >59)
GLOBULIN, TOTAL: 2.5 g/dL (ref 1.5–4.5)
Glucose: 88 mg/dL (ref 65–99)
Potassium: 4 mmol/L (ref 3.5–5.2)
Sodium: 140 mmol/L (ref 136–144)
Total Protein: 6.8 g/dL (ref 6.0–8.5)

## 2015-05-13 ENCOUNTER — Ambulatory Visit (INDEPENDENT_AMBULATORY_CARE_PROVIDER_SITE_OTHER): Payer: 59 | Admitting: Obstetrics & Gynecology

## 2015-05-13 ENCOUNTER — Encounter: Payer: Self-pay | Admitting: Obstetrics & Gynecology

## 2015-05-13 VITALS — BP 90/60 | HR 72 | Ht 66.0 in | Wt 124.0 lb

## 2015-05-13 DIAGNOSIS — N809 Endometriosis, unspecified: Secondary | ICD-10-CM | POA: Diagnosis not present

## 2015-05-13 MED ORDER — MEGESTROL ACETATE 40 MG PO TABS: ORAL_TABLET | ORAL | Status: DC

## 2015-05-13 NOTE — Progress Notes (Signed)
Patient ID: Kristen Mullins START, female   DOB: 1976/01/11, 40 y.o.   MRN: [id number]      Chief Complaint  Patient presents with  . gyn visit    talk about endometriosis    Blood pressure 90/60, pulse 72, height 5\' 6"  (1.676 m), weight 124 lb (56.246 kg), last menstrual period 04/24/2015.  40 y.o. No obstetric history on file. Patient's last menstrual period was 04/24/2015. The current method of family planning is OCP (estrogen/progesterone).  Subjective Pt with diagnosed endometriosis by laparoscopy her pain has wosened and since her surgery Wants definitive therapy  Objective Vulva:  normal appearing vulva with no masses, tenderness or lesions Vagina:  normal mucosa, no discharge Cervix:  cervical motion tenderness Uterus:  tender Adnexa: ovaries:present,  normal adnexa in size, nontender and no masses, tenderness bilateral    Pertinent ROS No burning with urination, frequency or urgency No nausea, vomiting or diarrhea Nor fever chills or other constitutional symptoms   Labs or studies     Impression Diagnoses this Encounter::   ICD-9-CM ICD-10-CM   1. Endometriosis determined by laparoscopy 617.9 N80.9     Established relevant diagnosis(es):   Plan/Recommendations: Meds ordered this encounter  Medications  . megestrol (MEGACE) 40 MG tablet    Sig: 3 tablets a day for 5 days, 2 tablets a day for 5 days then 1 tablet daily    Dispense:  45 tablet    Refill:  3    Labs or Scans Ordered: No orders of the defined types were placed in this encounter.    Management:: Will suppress with megestrol  Follow up  Return in about 6 weeks (around 06/24/2015) for DepoProvera injection, Follow up, with Dr Elonda Husky, GYN sono.        Face to face time:  20 minutes  Greater than 50% of the visit time was spent in counseling and coordination of care with the patient.  The summary and outline of the counseling and care coordination is summarized in the note above.   All  questions were answered.

## 2015-05-17 ENCOUNTER — Encounter: Payer: Self-pay | Admitting: *Deleted

## 2015-05-17 ENCOUNTER — Ambulatory Visit (INDEPENDENT_AMBULATORY_CARE_PROVIDER_SITE_OTHER): Payer: 59 | Admitting: *Deleted

## 2015-05-17 DIAGNOSIS — Z3202 Encounter for pregnancy test, result negative: Secondary | ICD-10-CM | POA: Diagnosis not present

## 2015-05-17 DIAGNOSIS — Z3042 Encounter for surveillance of injectable contraceptive: Secondary | ICD-10-CM | POA: Diagnosis not present

## 2015-05-17 LAB — POCT URINE PREGNANCY: Preg Test, Ur: NEGATIVE

## 2015-05-17 MED ORDER — MEDROXYPROGESTERONE ACETATE 150 MG/ML IM SUSP
150.0000 mg | Freq: Once | INTRAMUSCULAR | Status: AC
  Administered 2015-05-17: 150 mg via INTRAMUSCULAR

## 2015-05-17 NOTE — Progress Notes (Signed)
Pt here for Depo. Reports no problems at this time. Return in 12 weeks for next shot. [name] 

## 2015-06-24 ENCOUNTER — Ambulatory Visit (INDEPENDENT_AMBULATORY_CARE_PROVIDER_SITE_OTHER): Payer: 59 | Admitting: Obstetrics & Gynecology

## 2015-06-24 ENCOUNTER — Ambulatory Visit (INDEPENDENT_AMBULATORY_CARE_PROVIDER_SITE_OTHER): Payer: 59

## 2015-06-24 VITALS — BP 90/60 | HR 76 | Wt 124.0 lb

## 2015-06-24 DIAGNOSIS — N854 Malposition of uterus: Secondary | ICD-10-CM

## 2015-06-24 DIAGNOSIS — N809 Endometriosis, unspecified: Secondary | ICD-10-CM

## 2015-06-24 DIAGNOSIS — N946 Dysmenorrhea, unspecified: Secondary | ICD-10-CM

## 2015-06-24 DIAGNOSIS — N941 Unspecified dyspareunia: Secondary | ICD-10-CM | POA: Diagnosis not present

## 2015-06-24 NOTE — Progress Notes (Signed)
Patient ID: Kristen Mullins, female   DOB: 1975/09/11, 40 y.o.   MRN: [id number] Preoperative History and Physical  Kristen Mullins is a 40 y.o. G0P0000 with No LMP recorded. admitted for a abdominal hysterectomy and bilateral salpingo oophorectomy for chronic pelvic pain, dysmenorrhea and dyspareunia secondary to laparosocpically proven endometriosis.    PMH:    Past Medical History  Diagnosis Date  . External hemorrhoids   . Chronic diarrhea     Hx - IBS diet controlled  . Nausea     History  . Abdominal spasms   . Anxiety     panic attacks  . Depression     no meds  . Anemia     HX  . Cancer (HCC)     basil cell rt shoulder  . Endometriosis     PSH:     Past Surgical History  Procedure Laterality Date  . Colonoscopy  08/25/2010  . Wisdom tooth extraction    . Laparoscopy N/A 06/23/2013    Procedure: LAPAROSCOPY OPERATIVE AND FULGRATION OF ENDOMETRIOSIS;  Surgeon: Marylynn Pearson, MD;  Location: [facility];  Service: Gynecology;  Laterality: N/A;  cervical laceration repair     POb/GynH:      OB History    Gravida Para Term Preterm AB TAB SAB Ectopic Multiple Living   0 0 0 0 0 0 0 0 0 0       SH:   Social History  Substance Use Topics  . Smoking status: Never Smoker   . Smokeless tobacco: Never Used  . Alcohol Use: No    FH:    Family History  Problem Relation Age of Onset  . Diabetes Father   . Cancer Father     lung,skin     Allergies:  Allergies  Allergen Reactions  . Hydrocodone Nausea And Vomiting    Medications:       Current outpatient prescriptions:  .  acetaminophen (TYLENOL) 500 MG tablet, Take 1,000 mg by mouth every 4 (four) hours as needed for mild pain, moderate pain or headache. Reported on 05/13/2015, Disp: , Rfl:  .  ALPRAZolam (XANAX) 0.5 MG tablet, Take 1 tab QHS and additional 1/2 tab daily as needed, Disp: 45 tablet, Rfl: 4 .  medroxyPROGESTERone (DEPO-PROVERA) 150 MG/ML injection, Inject 150 mg into the muscle every 3 (three) months., Disp: ,  Rfl:  .  megestrol (MEGACE) 40 MG tablet, 3 tablets a day for 5 days, 2 tablets a day for 5 days then 1 tablet daily, Disp: 45 tablet, Rfl: 3  Review of Systems:   Review of Systems  Constitutional: Negative for fever, chills, weight loss, malaise/fatigue and diaphoresis.  HENT: Negative for hearing loss, ear pain, nosebleeds, congestion, sore throat, neck pain, tinnitus and ear discharge.   Eyes: Negative for blurred vision, double vision, photophobia, pain, discharge and redness.  Respiratory: Negative for cough, hemoptysis, sputum production, shortness of breath, wheezing and stridor.   Cardiovascular: Negative for chest pain, palpitations, orthopnea, claudication, leg swelling and PND.  Gastrointestinal: Positive for abdominal pain. Negative for heartburn, nausea, vomiting, diarrhea, constipation, blood in stool and melena.  Genitourinary: Negative for dysuria, urgency, frequency, hematuria and flank pain.  Musculoskeletal: Negative for myalgias, back pain, joint pain and falls.  Skin: Negative for itching and rash.  Neurological: Negative for dizziness, tingling, tremors, sensory change, speech change, focal weakness, seizures, loss of consciousness, weakness and headaches.  Endo/Heme/Allergies: Negative for environmental allergies and polydipsia. Does not bruise/bleed easily.  Psychiatric/Behavioral: Negative for depression,  suicidal ideas, hallucinations, memory loss and substance abuse. The patient is not nervous/anxious and does not have insomnia.      PHYSICAL EXAM:  Blood pressure 90/60, pulse 76, weight 124 lb (56.246 kg).    Vitals reviewed. Constitutional: She is oriented to person, place, and time. She appears well-developed and well-nourished.  HENT:  Head: Normocephalic and atraumatic.  Right Ear: External ear normal.  Left Ear: External ear normal.  Nose: Nose normal.  Mouth/Throat: Oropharynx is clear and moist.  Eyes: Conjunctivae and EOM are normal. Pupils are  equal, round, and reactive to light. Right eye exhibits no discharge. Left eye exhibits no discharge. No scleral icterus.  Neck: Normal range of motion. Neck supple. No tracheal deviation present. No thyromegaly present.  Cardiovascular: Normal rate, regular rhythm, normal heart sounds and intact distal pulses.  Exam reveals no gallop and no friction rub.   No murmur heard. Respiratory: Effort normal and breath sounds normal. No respiratory distress. She has no wheezes. She has no rales. She exhibits no tenderness.  GI: Soft. Bowel sounds are normal. She exhibits no distension and no mass. There is tenderness. There is no rebound and no guarding.  Genitourinary:       Vulva is normal without lesions Vagina is pink moist without discharge Cervix normal in appearance and pap is normal Uterus is normal size, contour, position, consistency, mobility, non-tender Adnexa is negative with normal sized ovaries by sonogram  Musculoskeletal: Normal range of motion. She exhibits no edema and no tenderness.  Neurological: She is alert and oriented to person, place, and time. She has normal reflexes. She displays normal reflexes. No cranial nerve deficit. She exhibits normal muscle tone. Coordination normal.  Skin: Skin is warm and dry. No rash noted. No erythema. No pallor.  Psychiatric: She has a normal mood and affect. Her behavior is normal. Judgment and thought content normal.    Labs: No results found for this or any previous visit (from the past 336 hour(s)).  EKG: No orders found for this or any previous visit.  Imaging Studies: US Transvaginal Non-ob  2015-06-27  GYNECOLOGIC SONOGRAM Kristen Mullins is a 40 y.o. G0P0000  LMP 04/28/2015 for a pelvic sonogram for endometriosis w/ lower back pain. Uterus                      6.7 x 4.7 x 3.4 cm, normal homogenous anteverted uterus Endometrium          4.8 mm, symmetrical, wnl Right ovary             2.3 x 2.2 x 1.1 cm, wnl Left ovary                2.1  x 2.3 x .9 cm, wnl No free fluid,some discomfort during ultrasound Technician Comments: PELVIC US TA/TV: normal homogenous anteverted uterus,normal ov's bilat,EEC 4.8 mm,no free fluid seen,some discomfort during ultrasound [employer] 2015/06/27 1:44 PM Clinical Impression and recommendations: I have reviewed the sonogram results above, combined with the patient's current clinical course, below are my impressions and any appropriate recommendations for management based on the sonographic findings. Uterus and endometrium is normal Ovaries are normal No evidence of endometriomas Rael Tilly H 06-27-2015 2:22 PM   US Pelvis Complete  June 27, 2015  GYNECOLOGIC SONOGRAM GARLA GASSERT is a 40 y.o. G0P0000  LMP 04/28/2015 for a pelvic sonogram for endometriosis w/ lower back pain. Uterus  6.7 x 4.7 x 3.4 cm, normal homogenous anteverted uterus Endometrium          4.8 mm, symmetrical, wnl Right ovary             2.3 x 2.2 x 1.1 cm, wnl Left ovary                2.1 x 2.3 x .9 cm, wnl No free fluid,some discomfort during ultrasound Technician Comments: PELVIC US TA/TV: normal homogenous anteverted uterus,normal ov's bilat,EEC 4.8 mm,no free fluid seen,some discomfort during ultrasound [employer] 06/24/2015 1:44 PM Clinical Impression and recommendations: I have reviewed the sonogram results above, combined with the patient's current clinical course, below are my impressions and any appropriate recommendations for management based on the sonographic findings. Uterus and endometrium is normal Ovaries are normal No evidence of endometriomas Sherryn Pollino H 06/24/2015 2:22 PM      Assessment:   ICD-9-CM ICD-10-CM   1. Endometriosis determined by laparoscopy 617.9 N80.9   2. Dysmenorrhea 625.3 N94.6   3. 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 Generalized anxiety disorder 12/08/2013    Plan: Abdominal hysterectomy with removal of both tubes and ovaries    Aunna Snooks H 06/24/2015 2:28 PM

## 2015-06-24 NOTE — Progress Notes (Signed)
PELVIC US TA/TV: normal homogenous anteverted uterus,normal ov's bilat,EEC 4.8 mm,no free fluid seen,some discomfort during ultrasound

## 2015-07-07 ENCOUNTER — Other Ambulatory Visit: Payer: Self-pay | Admitting: Obstetrics & Gynecology

## 2015-07-07 ENCOUNTER — Encounter: Payer: Self-pay | Admitting: Obstetrics & Gynecology

## 2015-07-15 NOTE — Patient Instructions (Signed)
Kristen Mullins  07/15/2015     @PREFPERIOPPHARMACY @   Your procedure is scheduled on 07/21/2015.  Report to [facility] at 8:00 A.M.  Call this number if you have problems the morning of surgery:  [phone]   Remember:  Do not eat food or drink liquids after midnight.  Take these medicines the morning of surgery with A SIP OF WATER Xanax, Megace   Do not wear jewelry, make-up or nail polish.  Do not wear lotions, powders, or perfumes.  You may wear deodorant.  Do not shave 48 hours prior to surgery.  Men may shave face and neck.  Do not bring valuables to the hospital.  [facility] is not responsible for any belongings or valuables.  Contacts, dentures or bridgework may not be worn into surgery.  Leave your suitcase in the car.  After surgery it may be brought to your room.  For patients admitted to the hospital, discharge time will be determined by your treatment team.  Patients discharged the day of surgery will not be allowed to drive home.    Please read over the following fact sheets that you were given. Surgical Site Infection Prevention and Anesthesia Post-op Instructions     PATIENT INSTRUCTIONS POST-ANESTHESIA  IMMEDIATELY FOLLOWING SURGERY:  Do not drive or operate machinery for the first twenty four hours after surgery.  Do not make any important decisions for twenty four hours after surgery or while taking narcotic pain medications or sedatives.  If you develop intractable nausea and vomiting or a severe headache please notify your doctor immediately.  FOLLOW-UP:  Please make an appointment with your surgeon as instructed. You do not need to follow up with anesthesia unless specifically instructed to do so.  WOUND CARE INSTRUCTIONS (if applicable):  Keep a dry clean dressing on the anesthesia/puncture wound site if there is drainage.  Once the wound has quit draining you may leave it open to air.  Generally you should leave the bandage intact for twenty four hours  unless there is drainage.  If the epidural site drains for more than 36-48 hours please call the anesthesia department.  QUESTIONS?:  Please feel free to call your physician or the hospital operator if you have any questions, and they will be happy to assist you.      Abdominal Hysterectomy Abdominal hysterectomy is a surgical procedure to remove your womb (uterus). Your uterus is the muscular organ that contains a developing baby. This surgery is done for many reasons. You may need an abdominal hysterectomy if you have cancer, growths (tumors), long-term pain, or bleeding. You may also have this procedure if your uterus has slipped down into your vagina (uterine prolapse). Depending on why you need an abdominal hysterectomy, you may also have other reproductive organs removed. These could include the part of your vagina that connects with your uterus (cervix), the organs that make eggs (ovaries), and the tubes that connect the ovaries to the uterus (fallopian tubes). LET [facility] CARE PROVIDER KNOW ABOUT:   Any allergies you have.  All medicines you are taking, including vitamins, herbs, eye drops, creams, and over-the-counter medicines.  Previous problems you or members of your family have had with the use of anesthetics.  Any blood disorders you have.  Previous surgeries you have had.  Medical conditions you have. RISKS AND COMPLICATIONS Generally, this is a safe procedure. However, as with any procedure, problems can occur. Infection is the most common problem after an abdominal hysterectomy. Other  possible problems include:  Bleeding.  Formation of blood clots that may break free and travel to your lungs.  Injury to other organs near your uterus.  Nerve injury causing nerve pain.  Decreased interest in sex or pain during sexual intercourse. BEFORE THE PROCEDURE  Abdominal hysterectomy is a major surgical procedure. It can affect the way you feel about yourself. Talk to your  health care provider about the physical and emotional changes hysterectomy may cause.  You may need to have blood work and X-rays done before surgery.  Quit smoking if you smoke. Ask your health care provider for help if you are struggling to quit.  Stop taking medicines that thin your blood as directed by your health care provider.  You may be instructed to take antibiotic medicines or laxatives before surgery.  Do not eat or drink anything for 6-8 hours before surgery.  Take your regular medicines with a small sip of water.  Bathe or shower the night or morning before surgery. PROCEDURE  Abdominal hysterectomy is done in the operating room at the hospital.  In most cases, you will be given a medicine that makes you go to sleep (general anesthetic).  The surgeon will make a cut (incision) through the skin in your lower belly.  The incision may be about 5-7 inches long. It may go side-to-side or up-and-down.  The surgeon will move aside the body tissue that covers your uterus. The surgeon will then carefully take out your uterus along with any of your other reproductive organs that need to be removed.  Bleeding will be controlled with clamps or sutures.  The surgeon will close your incision with sutures or metal clips. AFTER THE PROCEDURE  You will have some pain immediately after the procedure.  You will be given pain medicine in the recovery room.  You will be taken to your hospital room when you have recovered from the anesthesia.  You may need to stay in the hospital for 2-5 days.  You will be given instructions for recovery at home.   This information is not intended to replace advice given to you by your health care provider. Make sure you discuss any questions you have with your health care provider.   Document Released: 04/29/2013 Document Reviewed: 04/29/2013 Elsevier Interactive Patient Education [employer].

## 2015-07-15 NOTE — Pre-Procedure Instructions (Signed)
Dr Brynda Greathouse office notified of patient's desire to not have blood products.  Advised that Dr Elonda Husky would be out of the office until Monday and would send him a message to make him aware.

## 2015-07-16 ENCOUNTER — Encounter (HOSPITAL_COMMUNITY)
Admission: RE | Admit: 2015-07-16 | Discharge: 2015-07-16 | Disposition: A | Discharge status: Home / Self Care | Payer: 59 | Source: Ambulatory Visit | Attending: Obstetrics & Gynecology | Admitting: Obstetrics & Gynecology

## 2015-07-16 ENCOUNTER — Encounter (HOSPITAL_COMMUNITY): Payer: Self-pay

## 2015-07-16 DIAGNOSIS — Z01812 Encounter for preprocedural laboratory examination: Secondary | ICD-10-CM | POA: Diagnosis present

## 2015-07-16 DIAGNOSIS — N803 Endometriosis of pelvic peritoneum: Secondary | ICD-10-CM | POA: Insufficient documentation

## 2015-07-16 LAB — COMPREHENSIVE METABOLIC PANEL
ALBUMIN: 4 g/dL (ref 3.5–5.0)
ALK PHOS: 53 U/L (ref 38–126)
ALT: 12 U/L — AB (ref 14–54)
ANION GAP: 7 (ref 5–15)
AST: 17 U/L (ref 15–41)
BUN: 12 mg/dL (ref 6–20)
CALCIUM: 9.4 mg/dL (ref 8.9–10.3)
CHLORIDE: 106 mmol/L (ref 101–111)
CO2: 27 mmol/L (ref 22–32)
CREATININE: 0.84 mg/dL (ref 0.44–1.00)
GFR calc non Af Amer: >60 mL/min (ref >60)
GLUCOSE: 113 mg/dL — AB (ref 65–99)
Potassium: 3.9 mmol/L (ref 3.5–5.1)
SODIUM: 140 mmol/L (ref 135–145)
Total Bilirubin: 1 mg/dL (ref 0.3–1.2)
Total Protein: 7.1 g/dL (ref 6.5–8.1)

## 2015-07-16 LAB — URINALYSIS, ROUTINE W REFLEX MICROSCOPIC
Bilirubin Urine: NEGATIVE
Glucose, UA: NEGATIVE mg/dL
Ketones, ur: NEGATIVE mg/dL
Leukocytes, UA: NEGATIVE
NITRITE: NEGATIVE
PH: 5.5 (ref 5.0–8.0)
Protein, ur: NEGATIVE mg/dL
SPECIFIC GRAVITY, URINE: 1.02 (ref 1.005–1.030)

## 2015-07-16 LAB — CBC
HCT: 41.2 % (ref 36.0–46.0)
HEMOGLOBIN: 14 g/dL (ref 12.0–15.0)
MCH: 29.5 pg (ref 26.0–34.0)
MCHC: 34 g/dL (ref 30.0–36.0)
MCV: 86.9 fL (ref 78.0–100.0)
PLATELETS: 196 10*3/uL (ref 150–400)
RBC: 4.74 MIL/uL (ref 3.87–5.11)
RDW: 12.5 % (ref 11.5–15.5)
WBC: 4.2 10*3/uL (ref 4.0–10.5)

## 2015-07-16 LAB — HCG, QUANTITATIVE, PREGNANCY: hCG, Beta Chain, Quant, S: <1 m[IU]/mL (ref <5)

## 2015-07-16 LAB — URINE MICROSCOPIC-ADD ON
BACTERIA UA: NONE SEEN
SQUAMOUS EPITHELIAL / LPF: NONE SEEN
WBC UA: NONE SEEN WBC/hpf (ref 0–5)

## 2015-07-16 NOTE — Progress Notes (Signed)
Dr Patsey Berthold notified of Blood Product refusal and Hem 14/Hct 41.2.  No orders given.

## 2015-07-19 MED ORDER — TETRACAINE HCL 0.5 % OP SOLN
OPHTHALMIC | Status: AC
  Filled 2015-07-19: qty 4

## 2015-07-19 MED ORDER — PHENYLEPHRINE HCL 2.5 % OP SOLN
OPHTHALMIC | Status: AC
  Filled 2015-07-19: qty 15

## 2015-07-19 MED ORDER — KETOROLAC TROMETHAMINE 0.5 % OP SOLN
OPHTHALMIC | Status: AC
  Filled 2015-07-19: qty 5

## 2015-07-19 MED ORDER — CYCLOPENTOLATE-PHENYLEPHRINE OP SOLN OPTIME - NO CHARGE
OPHTHALMIC | Status: AC
Start: 2015-07-19 — End: 2015-07-19
  Filled 2015-07-19: qty 2

## 2015-07-21 ENCOUNTER — Encounter (HOSPITAL_COMMUNITY): Payer: Self-pay | Admitting: *Deleted

## 2015-07-21 ENCOUNTER — Inpatient Hospital Stay (HOSPITAL_COMMUNITY): Payer: 59 | Admitting: Anesthesiology

## 2015-07-21 ENCOUNTER — Inpatient Hospital Stay (HOSPITAL_COMMUNITY)
Admission: RE | Admit: 2015-07-21 | Discharge: 2015-07-22 | DRG: 743 | Disposition: A | Discharge status: Home / Self Care | Payer: 59 | Source: Ambulatory Visit | Attending: Obstetrics & Gynecology | Admitting: Obstetrics & Gynecology

## 2015-07-21 ENCOUNTER — Encounter (HOSPITAL_COMMUNITY)
Admission: RE | Disposition: A | Discharge status: Home / Self Care | Payer: Self-pay | Source: Ambulatory Visit | Attending: Obstetrics & Gynecology

## 2015-07-21 DIAGNOSIS — N858 Other specified noninflammatory disorders of uterus: Secondary | ICD-10-CM | POA: Diagnosis not present

## 2015-07-21 DIAGNOSIS — Z833 Family history of diabetes mellitus: Secondary | ICD-10-CM

## 2015-07-21 DIAGNOSIS — N809 Endometriosis, unspecified: Principal | ICD-10-CM | POA: Diagnosis present

## 2015-07-21 DIAGNOSIS — Z85828 Personal history of other malignant neoplasm of skin: Secondary | ICD-10-CM

## 2015-07-21 DIAGNOSIS — N941 Unspecified dyspareunia: Secondary | ICD-10-CM | POA: Diagnosis present

## 2015-07-21 DIAGNOSIS — D259 Leiomyoma of uterus, unspecified: Secondary | ICD-10-CM | POA: Diagnosis not present

## 2015-07-21 DIAGNOSIS — Z90710 Acquired absence of both cervix and uterus: Secondary | ICD-10-CM

## 2015-07-21 DIAGNOSIS — Z809 Family history of malignant neoplasm, unspecified: Secondary | ICD-10-CM

## 2015-07-21 DIAGNOSIS — F329 Major depressive disorder, single episode, unspecified: Secondary | ICD-10-CM | POA: Diagnosis present

## 2015-07-21 DIAGNOSIS — R102 Pelvic and perineal pain: Secondary | ICD-10-CM

## 2015-07-21 DIAGNOSIS — Z9079 Acquired absence of other genital organ(s): Secondary | ICD-10-CM

## 2015-07-21 DIAGNOSIS — N946 Dysmenorrhea, unspecified: Secondary | ICD-10-CM

## 2015-07-21 DIAGNOSIS — F411 Generalized anxiety disorder: Secondary | ICD-10-CM | POA: Diagnosis present

## 2015-07-21 DIAGNOSIS — F410 Panic disorder [episodic paroxysmal anxiety] without agoraphobia: Secondary | ICD-10-CM | POA: Diagnosis present

## 2015-07-21 DIAGNOSIS — Z90722 Acquired absence of ovaries, bilateral: Secondary | ICD-10-CM

## 2015-07-21 HISTORY — PX: ABDOMINAL HYSTERECTOMY: SHX81

## 2015-07-21 HISTORY — PX: SALPINGOOPHORECTOMY: SHX82

## 2015-07-21 SURGERY — HYSTERECTOMY, ABDOMINAL
Anesthesia: General

## 2015-07-21 MED ORDER — HYDROMORPHONE HCL 1 MG/ML IJ SOLN
1.0000 mg | INTRAMUSCULAR | Status: DC | PRN
  Administered 2015-07-21 – 2015-07-22 (×4): 1 mg via INTRAVENOUS
  Filled 2015-07-21 (×4): qty 1

## 2015-07-21 MED ORDER — FENTANYL CITRATE (PF) 250 MCG/5ML IJ SOLN
INTRAMUSCULAR | Status: AC
  Filled 2015-07-21: qty 5

## 2015-07-21 MED ORDER — LIDOCAINE HCL (PF) 1 % IJ SOLN
INTRAMUSCULAR | Status: AC
  Filled 2015-07-21: qty 5

## 2015-07-21 MED ORDER — FENTANYL CITRATE (PF) 250 MCG/5ML IJ SOLN
INTRAMUSCULAR | Status: DC | PRN
  Administered 2015-07-21 (×2): 25 ug via INTRAVENOUS
  Administered 2015-07-21 (×4): 50 ug via INTRAVENOUS

## 2015-07-21 MED ORDER — ONDANSETRON HCL 4 MG PO TABS
8.0000 mg | ORAL_TABLET | Freq: Four times a day (QID) | ORAL | Status: DC | PRN
  Administered 2015-07-21 – 2015-07-22 (×3): 8 mg via ORAL
  Filled 2015-07-21 (×3): qty 2

## 2015-07-21 MED ORDER — KETOROLAC TROMETHAMINE 30 MG/ML IJ SOLN
INTRAMUSCULAR | Status: AC
  Filled 2015-07-21: qty 1

## 2015-07-21 MED ORDER — ZOLPIDEM TARTRATE 5 MG PO TABS
5.0000 mg | ORAL_TABLET | Freq: Every evening | ORAL | Status: DC | PRN
  Filled 2015-07-21: qty 1

## 2015-07-21 MED ORDER — SCOPOLAMINE 1 MG/3DAYS TD PT72
MEDICATED_PATCH | TRANSDERMAL | Status: AC
  Filled 2015-07-21: qty 1

## 2015-07-21 MED ORDER — MIDAZOLAM HCL 2 MG/2ML IJ SOLN
INTRAMUSCULAR | Status: AC
  Filled 2015-07-21: qty 2

## 2015-07-21 MED ORDER — BISACODYL 10 MG RE SUPP: 10.0000 mg | Freq: Every day | RECTAL | Status: DC | PRN

## 2015-07-21 MED ORDER — KETOROLAC TROMETHAMINE 30 MG/ML IJ SOLN
30.0000 mg | Freq: Once | INTRAMUSCULAR | Status: AC
  Administered 2015-07-21: 30 mg via INTRAVENOUS

## 2015-07-21 MED ORDER — PROPOFOL 10 MG/ML IV BOLUS
INTRAVENOUS | Status: DC | PRN
  Administered 2015-07-21: 120 mg via INTRAVENOUS

## 2015-07-21 MED ORDER — BUPIVACAINE LIPOSOME 1.3 % IJ SUSP
INTRAMUSCULAR | Status: AC
  Filled 2015-07-21: qty 20

## 2015-07-21 MED ORDER — DOCUSATE SODIUM 100 MG PO CAPS
100.0000 mg | ORAL_CAPSULE | Freq: Two times a day (BID) | ORAL | Status: DC
  Administered 2015-07-21 – 2015-07-22 (×2): 100 mg via ORAL
  Filled 2015-07-21 (×3): qty 1

## 2015-07-21 MED ORDER — ONDANSETRON HCL 4 MG/2ML IJ SOLN: 4.0000 mg | Freq: Once | INTRAMUSCULAR | Status: DC | PRN

## 2015-07-21 MED ORDER — OXYCODONE-ACETAMINOPHEN 5-325 MG PO TABS
1.0000 | ORAL_TABLET | ORAL | Status: DC | PRN
Start: 2015-07-21 — End: 2015-07-22
  Administered 2015-07-22: 1 via ORAL
  Filled 2015-07-21: qty 2

## 2015-07-21 MED ORDER — FENTANYL CITRATE (PF) 100 MCG/2ML IJ SOLN
25.0000 ug | INTRAMUSCULAR | Status: DC | PRN
  Administered 2015-07-21 (×2): 50 ug via INTRAVENOUS
  Filled 2015-07-21: qty 2

## 2015-07-21 MED ORDER — DEXAMETHASONE SODIUM PHOSPHATE 4 MG/ML IJ SOLN
INTRAMUSCULAR | Status: AC
  Filled 2015-07-21: qty 1

## 2015-07-21 MED ORDER — LIDOCAINE HCL 1 % IJ SOLN
INTRAMUSCULAR | Status: DC | PRN
  Administered 2015-07-21: 40 mg via INTRADERMAL

## 2015-07-21 MED ORDER — GLYCOPYRROLATE 0.2 MG/ML IJ SOLN
INTRAMUSCULAR | Status: DC | PRN
  Administered 2015-07-21: .4 mg via INTRAVENOUS

## 2015-07-21 MED ORDER — ONDANSETRON HCL 4 MG/2ML IJ SOLN
INTRAMUSCULAR | Status: AC
  Filled 2015-07-21: qty 2

## 2015-07-21 MED ORDER — SENNOSIDES-DOCUSATE SODIUM 8.6-50 MG PO TABS: 1.0000 | ORAL_TABLET | Freq: Every evening | ORAL | Status: DC | PRN

## 2015-07-21 MED ORDER — PROPOFOL 10 MG/ML IV BOLUS
INTRAVENOUS | Status: AC
  Filled 2015-07-21: qty 20

## 2015-07-21 MED ORDER — GLYCOPYRROLATE 0.2 MG/ML IJ SOLN
INTRAMUSCULAR | Status: AC
  Filled 2015-07-21: qty 3

## 2015-07-21 MED ORDER — CEFAZOLIN SODIUM-DEXTROSE 2-3 GM-% IV SOLR
2.0000 g | INTRAVENOUS | Status: AC
  Administered 2015-07-21: 2 g via INTRAVENOUS

## 2015-07-21 MED ORDER — MENTHOL 3 MG MT LOZG: 1.0000 | LOZENGE | OROMUCOSAL | Status: DC | PRN

## 2015-07-21 MED ORDER — HEMOSTATIC AGENTS (NO CHARGE) OPTIME
TOPICAL | Status: DC | PRN
  Administered 2015-07-21: 1 via TOPICAL

## 2015-07-21 MED ORDER — SODIUM CHLORIDE 0.9 % IR SOLN
Status: DC | PRN
  Administered 2015-07-21: 1000 mL
  Administered 2015-07-21: 2000 mL

## 2015-07-21 MED ORDER — DEXAMETHASONE SODIUM PHOSPHATE 4 MG/ML IJ SOLN
4.0000 mg | Freq: Once | INTRAMUSCULAR | Status: AC
  Administered 2015-07-21: 4 mg via INTRAVENOUS

## 2015-07-21 MED ORDER — CEFAZOLIN SODIUM-DEXTROSE 2-3 GM-% IV SOLR
INTRAVENOUS | Status: AC
  Filled 2015-07-21: qty 50

## 2015-07-21 MED ORDER — ROCURONIUM BROMIDE 50 MG/5ML IV SOLN
INTRAVENOUS | Status: AC
  Filled 2015-07-21: qty 1

## 2015-07-21 MED ORDER — LACTATED RINGERS IV SOLN
INTRAVENOUS | Status: DC
  Administered 2015-07-21 (×2): via INTRAVENOUS
  Administered 2015-07-21: 1000 mL via INTRAVENOUS

## 2015-07-21 MED ORDER — ONDANSETRON HCL 4 MG/2ML IJ SOLN
4.0000 mg | Freq: Once | INTRAMUSCULAR | Status: AC
  Administered 2015-07-21: 4 mg via INTRAVENOUS

## 2015-07-21 MED ORDER — BUPIVACAINE LIPOSOME 1.3 % IJ SUSP
INTRAMUSCULAR | Status: DC | PRN
  Administered 2015-07-21: 20 mL

## 2015-07-21 MED ORDER — ALPRAZOLAM 0.5 MG PO TABS
0.5000 mg | ORAL_TABLET | Freq: Every day | ORAL | Status: DC
  Administered 2015-07-21: 0.5 mg via ORAL
  Filled 2015-07-21: qty 1

## 2015-07-21 MED ORDER — ALUM & MAG HYDROXIDE-SIMETH 200-200-20 MG/5ML PO SUSP: 30.0000 mL | ORAL | Status: DC | PRN

## 2015-07-21 MED ORDER — MIDAZOLAM HCL 2 MG/2ML IJ SOLN
1.0000 mg | INTRAMUSCULAR | Status: DC | PRN
  Administered 2015-07-21 (×2): 2 mg via INTRAVENOUS
  Filled 2015-07-21: qty 2

## 2015-07-21 MED ORDER — ONDANSETRON HCL 40 MG/20ML IJ SOLN
8.0000 mg | Freq: Four times a day (QID) | INTRAMUSCULAR | Status: DC | PRN
  Filled 2015-07-21: qty 4

## 2015-07-21 MED ORDER — ROCURONIUM BROMIDE 100 MG/10ML IV SOLN
INTRAVENOUS | Status: DC | PRN
  Administered 2015-07-21: 30 mg via INTRAVENOUS
  Administered 2015-07-21 (×4): 5 mg via INTRAVENOUS

## 2015-07-21 MED ORDER — NEOSTIGMINE METHYLSULFATE 10 MG/10ML IV SOLN
INTRAVENOUS | Status: DC | PRN
  Administered 2015-07-21: 2 mg via INTRAVENOUS

## 2015-07-21 MED ORDER — SCOPOLAMINE 1 MG/3DAYS TD PT72
1.0000 | MEDICATED_PATCH | Freq: Once | TRANSDERMAL | Status: DC
  Administered 2015-07-21: 1.5 mg via TRANSDERMAL

## 2015-07-21 MED ORDER — BUPIVACAINE LIPOSOME 1.3 % IJ SUSP
20.0000 mL | Freq: Once | INTRAMUSCULAR | Status: DC
  Filled 2015-07-21: qty 20

## 2015-07-21 MED ORDER — KCL IN DEXTROSE-NACL 20-5-0.45 MEQ/L-%-% IV SOLN
INTRAVENOUS | Status: DC
  Administered 2015-07-21 – 2015-07-22 (×3): via INTRAVENOUS

## 2015-07-21 SURGICAL SUPPLY — 48 items
APPLIER CLIP 13 LRG OPEN (CLIP)
BAG HAMPER (MISCELLANEOUS) ×3 IMPLANT
CELLS DAT CNTRL 66122 CELL SVR (MISCELLANEOUS) ×2 IMPLANT
CLIP APPLIE 13 LRG OPEN (CLIP) IMPLANT
CLOTH BEACON ORANGE TIMEOUT ST (SAFETY) ×3 IMPLANT
COVER LIGHT HANDLE STERIS (MISCELLANEOUS) ×6 IMPLANT
DRAPE WARM FLUID 44X44 (DRAPE) ×3 IMPLANT
DRSG OPSITE POSTOP 4X8 (GAUZE/BANDAGES/DRESSINGS) ×3 IMPLANT
DRSG TELFA 3X8 NADH (GAUZE/BANDAGES/DRESSINGS) IMPLANT
ELECT REM PT RETURN 9FT ADLT (ELECTROSURGICAL) ×3
ELECTRODE REM PT RTRN 9FT ADLT (ELECTROSURGICAL) ×2 IMPLANT
FORMALIN 10 PREFIL 480ML (MISCELLANEOUS) ×3 IMPLANT
GLOVE BIOGEL PI IND STRL 7.0 (GLOVE) ×2 IMPLANT
GLOVE BIOGEL PI IND STRL 8 (GLOVE) ×2 IMPLANT
GLOVE BIOGEL PI INDICATOR 7.0 (GLOVE) ×1
GLOVE BIOGEL PI INDICATOR 8 (GLOVE) ×1
GLOVE ECLIPSE 6.5 STRL STRAW (GLOVE) ×3 IMPLANT
GLOVE ECLIPSE 7.0 STRL STRAW (GLOVE) ×3 IMPLANT
GLOVE ECLIPSE 8.0 STRL XLNG CF (GLOVE) ×3 IMPLANT
GLOVE INDICATOR 7.0 STRL GRN (GLOVE) ×6 IMPLANT
GOWN STRL REUS W/TWL LRG LVL3 (GOWN DISPOSABLE) ×6 IMPLANT
GOWN STRL REUS W/TWL XL LVL3 (GOWN DISPOSABLE) ×3 IMPLANT
HEMOSTAT ARISTA ABSORB 3G PWDR (MISCELLANEOUS) ×3 IMPLANT
INST SET MAJOR GENERAL (KITS) ×3 IMPLANT
KIT ROOM TURNOVER APOR (KITS) ×3 IMPLANT
LIQUID BAND (GAUZE/BANDAGES/DRESSINGS) ×3 IMPLANT
MANIFOLD NEPTUNE II (INSTRUMENTS) ×3 IMPLANT
NEEDLE HYPO 21X1.5 SAFETY (NEEDLE) ×3 IMPLANT
NS IRRIG 1000ML POUR BTL (IV SOLUTION) ×9 IMPLANT
PACK ABDOMINAL MAJOR (CUSTOM PROCEDURE TRAY) ×3 IMPLANT
PAD ABD 5X9 TENDERSORB (GAUZE/BANDAGES/DRESSINGS) ×3 IMPLANT
PAD ARMBOARD 7.5X6 YLW CONV (MISCELLANEOUS) ×3 IMPLANT
RTRCTR WOUND ALEXIS 18CM MED (MISCELLANEOUS) ×3
SET BASIN LINEN APH (SET/KITS/TRAYS/PACK) ×3 IMPLANT
SPONGE GAUZE 4X4 12PLY (GAUZE/BANDAGES/DRESSINGS) ×3 IMPLANT
SPONGE LAP 18X18 X RAY DECT (DISPOSABLE) ×3 IMPLANT
STAPLER VISISTAT 35W (STAPLE) ×3 IMPLANT
SUT CHROMIC 0 CT 1 (SUTURE) ×3 IMPLANT
SUT MON AB 3-0 SH 27 (SUTURE) ×3 IMPLANT
SUT PLAIN 2 0 XLH (SUTURE) ×3 IMPLANT
SUT VIC AB 0 CT1 27 (SUTURE) ×4
SUT VIC AB 0 CT1 27XBRD ANTBC (SUTURE) ×2 IMPLANT
SUT VIC AB 0 CT1 27XCR 8 STRN (SUTURE) ×6 IMPLANT
SUT VIC AB 0 CTX 36 (SUTURE) ×1
SUT VIC AB 0 CTX36XBRD ANTBCTR (SUTURE) ×2 IMPLANT
SUT VICRYL 3 0 (SUTURE) ×3 IMPLANT
SYR 20CC LL (SYRINGE) ×3 IMPLANT
TRAY FOLEY CATH SILVER 16FR (SET/KITS/TRAYS/PACK) ×3 IMPLANT

## 2015-07-21 NOTE — Anesthesia Procedure Notes (Signed)
Procedure Name: Intubation Date/Time: 07/21/2015 10:08 AM Performed by: Tressie Stalker E Pre-anesthesia Checklist: Patient identified, Patient being monitored, Timeout performed, Emergency Drugs available and Suction available Patient Re-evaluated:Patient Re-evaluated prior to inductionOxygen Delivery Method: Circle System Utilized Preoxygenation: Pre-oxygenation with 100% oxygen Intubation Type: IV induction Ventilation: Mask ventilation without difficulty Laryngoscope Size: Mac and 3 Grade View: Grade I Tube type: Oral Tube size: 7.0 mm Number of attempts: 1 Airway Equipment and Method: Stylet Placement Confirmation: ETT inserted through vocal cords under direct vision,  positive ETCO2 and breath sounds checked- equal and bilateral Secured at: 21 cm Tube secured with: Tape Dental Injury: Teeth and Oropharynx as per pre-operative assessment

## 2015-07-21 NOTE — Transfer of Care (Signed)
Immediate Anesthesia Transfer of Care Note  Patient: Kristen Mullins  Procedure(s) Performed: Procedure(s): HYSTERECTOMY ABDOMINAL (N/A) SALPINGO OOPHORECTOMY (Bilateral)  Patient Location: PACU  Anesthesia Type:General  Level of Consciousness: awake and alert   Airway & Oxygen Therapy: Patient Spontanous Breathing and Patient connected to face mask oxygen  Post-op Assessment: Report given to RN  Post vital signs: Reviewed and stable  Last Vitals:  Filed Vitals:   07/21/15 0950 07/21/15 0955  BP: 108/70   Pulse:    Temp:    Resp: 24 22    Complications: No apparent anesthesia complications

## 2015-07-21 NOTE — H&P (Signed)
Preoperative History and Physical  Kristen Mullins is a 40 y.o. G0P0000 with No LMP recorded. admitted for a abdominal hysterectomy and bilateral salpingo oophorectomy for chronic pelvic pain, dysmenorrhea and dyspareunia secondary to laparosocpically proven endometriosis.    PMH:  Past Medical History  Diagnosis Date  . External hemorrhoids   . Chronic diarrhea     Hx - IBS diet controlled  . Nausea     History  . Abdominal spasms   . Anxiety     panic attacks  . Depression     no meds  . Anemia     HX  . Cancer (HCC)     basil cell rt shoulder  . Endometriosis     PSH:  Past Surgical History  Procedure Laterality Date  . Colonoscopy  08/25/2010  . Wisdom tooth extraction    . Laparoscopy N/A 06/23/2013    Procedure: LAPAROSCOPY OPERATIVE AND FULGRATION OF ENDOMETRIOSIS; Surgeon: Marylynn Pearson, MD; Location: [facility]; Service: Gynecology; Laterality: N/A; cervical laceration repair     POb/GynH:  OB History    Gravida Para Term Preterm AB TAB SAB Ectopic Multiple Living   0 0 0 0 0 0 0 0 0 0       SH:  Social History  Substance Use Topics  . Smoking status: Never Smoker   . Smokeless tobacco: Never Used  . Alcohol Use: No    FH:  Family History  Problem Relation Age of Onset  . Diabetes Father   . Cancer Father     lung,skin     Allergies:  Allergies  Allergen Reactions  . Hydrocodone Nausea And Vomiting    Medications:  Current outpatient prescriptions:  . acetaminophen (TYLENOL) 500 MG tablet, Take 1,000 mg by mouth every 4 (four) hours as needed for mild pain, moderate pain or headache. Reported on 05/13/2015, Disp: , Rfl:  . ALPRAZolam (XANAX) 0.5 MG tablet, Take 1 tab QHS and additional 1/2 tab daily as needed, Disp: 45 tablet, Rfl: 4 . medroxyPROGESTERone (DEPO-PROVERA) 150 MG/ML injection, Inject  150 mg into the muscle every 3 (three) months., Disp: , Rfl:  . megestrol (MEGACE) 40 MG tablet, 3 tablets a day for 5 days, 2 tablets a day for 5 days then 1 tablet daily, Disp: 45 tablet, Rfl: 3  Review of Systems:   Review of Systems  Constitutional: Negative for fever, chills, weight loss, malaise/fatigue and diaphoresis.  HENT: Negative for hearing loss, ear pain, nosebleeds, congestion, sore throat, neck pain, tinnitus and ear discharge.  Eyes: Negative for blurred vision, double vision, photophobia, pain, discharge and redness.  Respiratory: Negative for cough, hemoptysis, sputum production, shortness of breath, wheezing and stridor.  Cardiovascular: Negative for chest pain, palpitations, orthopnea, claudication, leg swelling and PND.  Gastrointestinal: Positive for abdominal pain. Negative for heartburn, nausea, vomiting, diarrhea, constipation, blood in stool and melena.  Genitourinary: Negative for dysuria, urgency, frequency, hematuria and flank pain.  Musculoskeletal: Negative for myalgias, back pain, joint pain and falls.  Skin: Negative for itching and rash.  Neurological: Negative for dizziness, tingling, tremors, sensory change, speech change, focal weakness, seizures, loss of consciousness, weakness and headaches.  Endo/Heme/Allergies: Negative for environmental allergies and polydipsia. Does not bruise/bleed easily.  Psychiatric/Behavioral: Negative for depression, suicidal ideas, hallucinations, memory loss and substance abuse. The patient is not nervous/anxious and does not have insomnia.     PHYSICAL EXAM:  Blood pressure 90/60, pulse 76, weight 124 lb (56.246 kg).   Vitals reviewed. Constitutional: She is oriented to  person, place, and time. She appears well-developed and well-nourished.  HENT:  Head: Normocephalic and atraumatic.  Right Ear: External ear normal.  Left Ear: External ear normal.  Nose: Nose normal.  Mouth/Throat: Oropharynx is clear and  moist.  Eyes: Conjunctivae and EOM are normal. Pupils are equal, round, and reactive to light. Right eye exhibits no discharge. Left eye exhibits no discharge. No scleral icterus.  Neck: Normal range of motion. Neck supple. No tracheal deviation present. No thyromegaly present.  Cardiovascular: Normal rate, regular rhythm, normal heart sounds and intact distal pulses. Exam reveals no gallop and no friction rub.  No murmur heard. Respiratory: Effort normal and breath sounds normal. No respiratory distress. She has no wheezes. She has no rales. She exhibits no tenderness.  GI: Soft. Bowel sounds are normal. She exhibits no distension and no mass. There is tenderness. There is no rebound and no guarding.  Genitourinary:   Vulva is normal without lesions Vagina is pink moist without discharge Cervix normal in appearance and pap is normal Uterus is normal size, contour, position, consistency, mobility, non-tender Adnexa is negative with normal sized ovaries by sonogram  Musculoskeletal: Normal range of motion. She exhibits no edema and no tenderness.  Neurological: She is alert and oriented to person, place, and time. She has normal reflexes. She displays normal reflexes. No cranial nerve deficit. She exhibits normal muscle tone. Coordination normal.  Skin: Skin is warm and dry. No rash noted. No erythema. No pallor.  Psychiatric: She has a normal mood and affect. Her behavior is normal. Judgment and thought content normal.    Labs: No results found for this or any previous visit (from the past 336 hour(s)).  EKG: No orders found for this or any previous visit.  Imaging Studies:  Imaging Results    US Transvaginal Non-ob  06/24/2015 GYNECOLOGIC SONOGRAM PHILIP MOA is a 40 y.o. G0P0000 LMP 04/28/2015 for a pelvic sonogram for endometriosis w/ lower back pain. Uterus  6.7 x 4.7 x 3.4 cm, normal homogenous anteverted uterus Endometrium 4.8 mm, symmetrical,  wnl Right ovary 2.3 x 2.2 x 1.1 cm, wnl Left ovary 2.1 x 2.3 x .9 cm, wnl No free fluid,some discomfort during ultrasound Technician Comments: PELVIC US TA/TV: normal homogenous anteverted uterus,normal ov's bilat,EEC 4.8 mm,no free fluid seen,some discomfort during ultrasound [employer] 06/24/2015 1:44 PM Clinical Impression and recommendations: I have reviewed the sonogram results above, combined with the patient's current clinical course, below are my impressions and any appropriate recommendations for management based on the sonographic findings. Uterus and endometrium is normal Ovaries are normal No evidence of endometriomas EURE,LUTHER H 06/24/2015 2:22 PM   US Pelvis Complete  06/24/2015 GYNECOLOGIC SONOGRAM MERYN STYER is a 40 y.o. G0P0000 LMP 04/28/2015 for a pelvic sonogram for endometriosis w/ lower back pain. Uterus  6.7 x 4.7 x 3.4 cm, normal homogenous anteverted uterus Endometrium 4.8 mm, symmetrical, wnl Right ovary 2.3 x 2.2 x 1.1 cm, wnl Left ovary 2.1 x 2.3 x .9 cm, wnl No free fluid,some discomfort during ultrasound Technician Comments: PELVIC US TA/TV: normal homogenous anteverted uterus,normal ov's bilat,EEC 4.8 mm,no free fluid seen,some discomfort during ultrasound [employer] 06/24/2015 1:44 PM Clinical Impression and recommendations: I have reviewed the sonogram results above, combined with the patient's current clinical course, below are my impressions and any appropriate recommendations for management based on the sonographic findings. Uterus and endometrium is normal Ovaries are normal No evidence of endometriomas EURE,LUTHER H 06/24/2015 2:22 PM  Assessment:   ICD-9-CM ICD-10-CM   1. Endometriosis determined by laparoscopy 617.9 N80.9   2. Dysmenorrhea 625.3 N94.6   3. 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 Generalized anxiety disorder 12/08/2013    Plan: Abdominal hysterectomy with removal of both tubes and ovaries        Florian Buff, MD 07/21/2015 9:31 AM

## 2015-07-21 NOTE — Anesthesia Postprocedure Evaluation (Signed)
Anesthesia Post Note  Patient: Kristen Mullins  Procedure(s) Performed: Procedure(s) (LRB): HYSTERECTOMY ABDOMINAL (N/A) SALPINGO OOPHORECTOMY (Bilateral)  Patient location during evaluation: PACU Anesthesia Type: General Level of consciousness: awake and alert and oriented Pain management: pain level controlled Vital Signs Assessment: post-procedure vital signs reviewed and stable Respiratory status: spontaneous breathing Cardiovascular status: blood pressure returned to baseline Postop Assessment: no signs of nausea or vomiting Anesthetic complications: no    Last Vitals:  Filed Vitals:   07/21/15 1245 07/21/15 1250  BP: 122/79   Pulse: 88 84  Temp:    Resp: 15 15    Last Pain:  Filed Vitals:   07/21/15 1253  PainSc: Asleep                 Winfred Iiams

## 2015-07-21 NOTE — Anesthesia Preprocedure Evaluation (Signed)
Anesthesia Evaluation  Patient identified by MRN, date of birth, ID band Patient awake    Reviewed: Allergy & Precautions, H&P , NPO status , Patient's Chart, lab work & pertinent test results, reviewed documented beta blocker date and time   History of Anesthesia Complications Negative for: history of anesthetic complications  Airway Mallampati: II  TM Distance: >3 FB Neck ROM: full    Dental  (+) Teeth Intact   Pulmonary neg pulmonary ROS,    Pulmonary exam normal breath sounds clear to auscultation       Cardiovascular Exercise Tolerance: Good negative cardio ROS   Rhythm:regular Rate:Normal     Neuro/Psych PSYCHIATRIC DISORDERS (anxiety, depression, panic attacks) Anxiety Depression negative neurological ROS     GI/Hepatic negative GI ROS, Neg liver ROS,   Endo/Other  negative endocrine ROS  Renal/GU negative Renal ROS     Musculoskeletal   Abdominal   Peds  Hematology  (+) anemia , REFUSES BLOOD PRODUCTS,   Anesthesia Other Findings   Reproductive/Obstetrics negative OB ROS                             Anesthesia Physical Anesthesia Plan  ASA: II  Anesthesia Plan: General   Post-op Pain Management:    Induction: Intravenous  Airway Management Planned: Oral ETT  Additional Equipment:   Intra-op Plan:   Post-operative Plan: Extubation in OR  Informed Consent: I have reviewed the patients History and Physical, chart, labs and discussed the procedure including the risks, benefits and alternatives for the proposed anesthesia with the patient or authorized representative who has indicated his/her understanding and acceptance.     Plan Discussed with:   Anesthesia Plan Comments:         Anesthesia Quick Evaluation

## 2015-07-21 NOTE — Op Note (Signed)
Preoperative diagnosis:  1.  Endometriosis determined by laparoscopy                                          2.  dysmenorrhea                                         3.  dyspareunia                                         4.  Chronic pelvic pain                                         5.  Endometriosis  Postoperative diagnosis:  Same as above   Procedure:  Abdominal hysterectomy with removal of both tubes and ovaries  Surgeon:  Florian Buff  Assistant:    Anesthesia:  General endotracheal  Preoperative clinical summary:  Kristen Mullins is a 40 y.o. G0P0000 with No LMP recorded. admitted for a abdominal hysterectomy and bilateral salpingo oophorectomy for chronic pelvic pain, dysmenorrhea and dyspareunia secondary to laparosocpically proven endometriosis  Intraoperative findings: pt has been on megestrol for about 4 months with pretty good response.  Her endometriosis appears burned out, suppressed, ovaries and peritoneal surfaces appeared normal  Description of operation:  Patient was taken to the operating room and placed in the supine position where she underwent general endotracheal anesthesia.  She was then prepped and draped in the usual sterile fashion and a Foley catheter was placed for continuous bladder drainage.  A Pfannenstiel skin incision was made and carried down sharply to the rectus fascia which was scored in the midline and extended laterally.  The fascia was taken off the muscles superiorly and inferiorly without difficulty.  The muscles were divided.  The peritoneal cavity was entered.  An medium Alexis self-retaining retractor was placed.  The upper abdomen was packed away. Both uterine cornu were grasped with Coker clamps.  The left round ligament was suture ligated and coagulated with the electrocautery unit.  The left vesicouterine serosal flap was created.  An avascular window in in the peritoneum was created and the utero-ovarian ligament was cross clamped, cut and suture  ligated.  The right round ligament was suture ligated and cut with the electrocautery unit.  The vesicouterine serosal flap on the right was created.  An avascular window in the peritoneum was created and the right utero-ovarian ligament was cross clamped, cut and double suture ligated.  Both infundibulo pelvic ligaments were then cross clamped and removed.  Both pedicles were double suture ligated..  The uterine vessels were skeletonized bilaterally.  The uterine vessels were clamped bilaterally,  then cut and suture ligated.  Two more pedicles were taken down the cervix medial to the uterine vessels.  Each pedicle was clamped cut and suture ligated with good resulting hemostasis.  The vagina was cross clamped and the uterus was removed.    The pelvis was irrigated vigorously and all pedicles were examined and found to be hemostatic.   All specimens were sent to pathology for routine evaluation.  The pelvic  peritoneum was then closed.  Both ureters were identified throughout the procedure and there was normal peristalsis bilaterallyl at the end of the caseThe Alexis self-retaining retractor was removed and the pelvis was irrigated vigorously.  All packs were removed and all counts were correct at this point x 3.  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  The fascia was closed with 0 Vicryl running.  The skin was closed using 3-0 Vicryl on a Keith needle in a subcuticular fashion. liqui ban was then applied for additional wound integrity and to serve as a postoperative bacterial barrier.  The patient was awakened from anesthesia taken to the recovery room in good stable condition. All sponge instrument and needle counts were correct x 3.  The patient received Ancef and Toradol prophylactically preoperatively.  Estimated blood loss for the  procedure was 100  Cc.    Isac Lincks H 07/21/2015 12:00 PM

## 2015-07-22 ENCOUNTER — Encounter (HOSPITAL_COMMUNITY): Payer: Self-pay | Admitting: Obstetrics & Gynecology

## 2015-07-22 DIAGNOSIS — Z85828 Personal history of other malignant neoplasm of skin: Secondary | ICD-10-CM | POA: Diagnosis not present

## 2015-07-22 DIAGNOSIS — N941 Unspecified dyspareunia: Secondary | ICD-10-CM | POA: Diagnosis present

## 2015-07-22 DIAGNOSIS — N946 Dysmenorrhea, unspecified: Secondary | ICD-10-CM | POA: Diagnosis present

## 2015-07-22 DIAGNOSIS — F411 Generalized anxiety disorder: Secondary | ICD-10-CM | POA: Diagnosis present

## 2015-07-22 DIAGNOSIS — F329 Major depressive disorder, single episode, unspecified: Secondary | ICD-10-CM | POA: Diagnosis present

## 2015-07-22 DIAGNOSIS — Z833 Family history of diabetes mellitus: Secondary | ICD-10-CM | POA: Diagnosis not present

## 2015-07-22 DIAGNOSIS — N809 Endometriosis, unspecified: Secondary | ICD-10-CM | POA: Diagnosis present

## 2015-07-22 DIAGNOSIS — Z809 Family history of malignant neoplasm, unspecified: Secondary | ICD-10-CM | POA: Diagnosis not present

## 2015-07-22 DIAGNOSIS — F410 Panic disorder [episodic paroxysmal anxiety] without agoraphobia: Secondary | ICD-10-CM | POA: Diagnosis present

## 2015-07-22 LAB — CBC
HCT: 33.5 % — ABNORMAL LOW (ref 36.0–46.0)
Hemoglobin: 11.5 g/dL — ABNORMAL LOW (ref 12.0–15.0)
MCH: 30.1 pg (ref 26.0–34.0)
MCHC: 34.3 g/dL (ref 30.0–36.0)
MCV: 87.7 fL (ref 78.0–100.0)
PLATELETS: 200 10*3/uL (ref 150–400)
RBC: 3.82 MIL/uL — ABNORMAL LOW (ref 3.87–5.11)
RDW: 12.6 % (ref 11.5–15.5)
WBC: 11.7 10*3/uL — AB (ref 4.0–10.5)

## 2015-07-22 LAB — BASIC METABOLIC PANEL
ANION GAP: 5 (ref 5–15)
BUN: 6 mg/dL (ref 6–20)
CALCIUM: 8.5 mg/dL — AB (ref 8.9–10.3)
CO2: 25 mmol/L (ref 22–32)
CREATININE: 0.86 mg/dL (ref 0.44–1.00)
Chloride: 110 mmol/L (ref 101–111)
GLUCOSE: 163 mg/dL — AB (ref 65–99)
Potassium: 4.4 mmol/L (ref 3.5–5.1)
Sodium: 140 mmol/L (ref 135–145)

## 2015-07-22 MED ORDER — KETOROLAC TROMETHAMINE 10 MG PO TABS: 10.0000 mg | ORAL_TABLET | Freq: Three times a day (TID) | ORAL | Status: DC | PRN

## 2015-07-22 MED ORDER — HYDROMORPHONE HCL 1 MG/ML IJ SOLN
1.0000 mg | Freq: Once | INTRAMUSCULAR | Status: AC
  Administered 2015-07-22: 1 mg via INTRAVENOUS
  Filled 2015-07-22: qty 1

## 2015-07-22 MED ORDER — ONDANSETRON HCL 8 MG PO TABS: 8.0000 mg | ORAL_TABLET | Freq: Four times a day (QID) | ORAL | Status: DC | PRN

## 2015-07-22 MED ORDER — HYDROMORPHONE HCL 2 MG PO TABS: 2.0000 mg | ORAL_TABLET | ORAL | Status: DC | PRN

## 2015-07-22 NOTE — Discharge Instructions (Signed)
Abdominal Hysterectomy, Care After °Refer to this sheet in the next few weeks. These instructions provide you with information on caring for yourself after your procedure. Your health care provider may also give you more specific instructions. Your treatment has been planned according to current medical practices, but problems sometimes occur. Call your health care provider if you have any problems or questions after your procedure.  °WHAT TO EXPECT AFTER THE PROCEDURE °After your procedure, it is typical to have the following: °· Pain. °· Feeling tired. °· Poor appetite. °· Less interest in sex. °It takes 4-6 weeks to recover from this surgery.  °HOME CARE INSTRUCTIONS  °· Take pain medicines only as directed by your health care provider. Do not take over-the-counter pain medicines without checking with your health care provider first.  °· Change your bandage as directed by your health care provider. °· Return to your health care provider to have your sutures taken out. °· Take showers instead of baths for 2-3 weeks. Ask your health care provider when it is safe to start showering.  °· Do not douche, use tampons, or have sexual intercourse for at least 6 weeks or until your health care provider says you can.   °· Follow your health care provider's advice about exercise, lifting, driving, and general activities. °· Get plenty of rest and sleep.   °· Do not lift anything heavier than a gallon of milk (about 10 lb [4.5 kg]) for the first month after surgery. °· You can resume your normal diet if your health care provider says it is okay.   °· Do not drink alcohol until your health care provider says you can.   °· If you are constipated, ask your health care provider if you can take a mild laxative. °· Eating foods high in fiber may also help with constipation. Eat plenty of raw fruits and vegetables, whole grains, and beans. °· Drink enough fluids to keep your urine clear or pale yellow.   °· Try to have someone at  home with you for the first 1-2 weeks to help around the house. °· Keep all follow-up appointments. °SEEK MEDICAL CARE IF:  °· You have chills or fever. °· You have swelling, redness, or pain in the area of your incision that is getting worse.   °· You have pus coming from the incision.   °· You notice a bad smell coming from the incision or bandage.   °· Your incision breaks open.   °· You feel dizzy or light-headed.   °· You have pain or bleeding when you urinate.   °· You have persistent diarrhea.   °· You have persistent nausea and vomiting.   °· You have abnormal vaginal discharge.   °· You have a rash.   °· You have any type of abnormal reaction or develop an allergy to your medicine.   °· Your pain medicine is not helping.   °SEEK IMMEDIATE MEDICAL CARE IF:  °· You have a fever and your symptoms suddenly get worse. °· You have severe abdominal pain. °· You have chest pain. °· You have shortness of breath. °· You faint. °· You have pain, swelling, or redness of your leg. °· You have heavy vaginal bleeding with blood clots. °MAKE SURE YOU: °· Understand these instructions. °· Will watch your condition. °· Will get help right away if you are not doing well or get worse. °  °This information is not intended to replace advice given to you by your health care provider. Make sure you discuss any questions you have with your health care provider. °  °Document   Released: 11/11/2004 Document Revised: 05/15/2014 Document Reviewed: 02/14/2013 °Elsevier Interactive Patient Education ©2016 [employer] ° °

## 2015-07-22 NOTE — Care Management Note (Signed)
Case Management Note  Patient Details  Name: Kristen Mullins MRN: [id number] Date of Birth: Jul 20, 1975  Subjective/Objective:          Spoke with patient for discharge plan. Patient alert and oriented uses no DME . Independent and drives self. Lives with spouse.           Action/Plan:  Home with self care. Expected Discharge Date:                  Expected Discharge Plan:  Home/Self Care  In-House Referral:     Discharge planning Services  CM Consult  Post Acute Care Choice:    Choice offered to:     DME Arranged:    DME Agency:     HH Arranged:    [place] Agency:     Status of Service:  Completed, signed off  Medicare Important Message Given:    Date Medicare IM Given:    Medicare IM give by:    Date Additional Medicare IM Given:    Additional Medicare Important Message give by:     If discussed at [place] of Stay Meetings, dates discussed:    Additional Comments:  Alvie Heidelberg, RN 07/22/2015, 11:23 AM

## 2015-07-22 NOTE — Discharge Summary (Signed)
Physician Discharge Summary  Patient ID: Kristen Mullins MRN: [id number] DOB/AGE: [birth year] 41 y.o.  Admit date: 07/21/2015 Discharge date: 07/22/2015  Admission Diagnoses:endometriosis and chronic pelvic pain, dyspareunia and dysmenorrhea  Discharge Diagnoses:  Active Problems:   S/P total hysterectomy and bilateral salpingo-oophorectomy   Discharged Condition: good  Hospital Course: unremarkable  Consults: None  Significant Diagnostic Studies:   Treatments: surgery: TAHBSO  Discharge Exam: Blood pressure 105/66, pulse 81, temperature 98.8 F (37.1 C), temperature source Oral, resp. rate 20, height 5\' 6"  (1.676 m), weight 124 lb (56.246 kg), last menstrual period 11/26/2014, SpO2 97 %. General appearance: alert, cooperative and no distress GI: soft, non-tender; bowel sounds normal; no masses,  no organomegaly Incision/Wound:clean dry intact  Disposition: 01-Home or Self Care  Discharge Instructions    Call MD for:  persistant nausea and vomiting    Complete by:  As directed      Call MD for:  severe uncontrolled pain    Complete by:  As directed      Call MD for:  temperature >100.4    Complete by:  As directed      Diet - low sodium heart healthy    Complete by:  As directed      Driving Restrictions    Complete by:  As directed   No driving for at least 1 week     Increase activity slowly    Complete by:  As directed      Leave dressing on - Keep it clean, dry, and intact until clinic visit    Complete by:  As directed      Lifting restrictions    Complete by:  As directed   No lifting more than 10 pounds     Sexual Activity Restrictions    Complete by:  As directed   No sex for 6 weeks            Medication List    STOP taking these medications        medroxyPROGESTERone 150 MG/ML injection  Commonly known as:  DEPO-PROVERA     megestrol 40 MG tablet  Commonly known as:  MEGACE      TAKE these medications        acetaminophen 500 MG tablet   Commonly known as:  TYLENOL  Take 1,000 mg by mouth every 4 (four) hours as needed for mild pain, moderate pain or headache. Reported on 05/13/2015     ALPRAZolam 0.5 MG tablet  Commonly known as:  XANAX  Take 1 tab QHS and additional 1/2 tab daily as needed     HYDROmorphone 2 MG tablet  Commonly known as:  DILAUDID  Take 1-2 tablets (2-4 mg total) by mouth every 4 (four) hours as needed for severe pain.     ketorolac 10 MG tablet  Commonly known as:  TORADOL  Take 1 tablet (10 mg total) by mouth every 8 (eight) hours as needed.     ondansetron 8 MG tablet  Commonly known as:  ZOFRAN  Take 1 tablet (8 mg total) by mouth every 6 (six) hours as needed for nausea.           Follow-up Information    Follow up with Florian Buff, MD In 1 week.   Specialties:  Obstetrics and Gynecology, Radiology   Why:  post op visit   Contact information:   [address] [place] [address] [phone]       Signed: Florian Buff  07/22/2015, 1:08 PM

## 2015-07-22 NOTE — Addendum Note (Signed)
Addendum  created 07/22/15 1110 by Mickel Baas, CRNA   Modules edited: Charges VN

## 2015-07-22 NOTE — Addendum Note (Signed)
Addendum  created 07/22/15 1150 by Mickel Baas, CRNA   Modules edited: Clinical Notes   Clinical Notes:  File: [id number]

## 2015-07-22 NOTE — Progress Notes (Signed)
Patient reports pain relief after receiving dilaudid as ordered. Discharge instructions reviewed with patient. Given copy of AVS and prescriptions. Verbalized understanding of instructions, f/u appointment and when to call MD. Family at bedside for discharge home. Abdominal dressing removed by MD, honeycomb dressing in place. Pt verbalized understanding of incision monitoring/care. IV site d/c'd, site within normal limits. Pt in stable condition awaiting discharge home. Donavan Foil, RN

## 2015-07-22 NOTE — Progress Notes (Signed)
Patient c/o nausea, has eaten jello and been sipping fluids this morning. Has voided independently since foley removed this am. Ambulates in room, c/o pain with activity. Discussed use of pillow to support abdominal area with changing positions. Pt agreed to take Percocet 1 tablet as ordered PRN for pain earlier this morning. Pt stated "Can he give me one more dose of the IV pain medicine?". Discussed with Dr. Elonda Husky. Order received for Dilaudid 1 mg IV x 1 dose. Donavan Foil, RN

## 2015-07-22 NOTE — Anesthesia Postprocedure Evaluation (Signed)
Anesthesia Post Note  Patient: Kristen Mullins  Procedure(s) Performed: Procedure(s) (LRB): HYSTERECTOMY ABDOMINAL (N/A) SALPINGO OOPHORECTOMY (Bilateral)  Patient location during evaluation: Nursing Unit Anesthesia Type: General Level of consciousness: awake and alert and oriented Pain management: pain level controlled Vital Signs Assessment: post-procedure vital signs reviewed and stable Respiratory status: spontaneous breathing Cardiovascular status: stable Postop Assessment: no signs of nausea or vomiting Anesthetic complications: no    Last Vitals:  Filed Vitals:   07/21/15 1934 07/22/15 0528  BP: 118/79 105/66  Pulse: 79 81  Temp: 37.1 C 37.1 C  Resp: 20 20    Last Pain:  Filed Vitals:   07/22/15 1117  PainSc: 8                  Aryn Safran A

## 2015-07-29 ENCOUNTER — Encounter: Payer: Self-pay | Admitting: Obstetrics & Gynecology

## 2015-07-29 ENCOUNTER — Ambulatory Visit (INDEPENDENT_AMBULATORY_CARE_PROVIDER_SITE_OTHER): Payer: 59 | Admitting: Obstetrics & Gynecology

## 2015-07-29 VITALS — BP 90/70 | HR 76 | Wt 116.4 lb

## 2015-07-29 DIAGNOSIS — Z9889 Other specified postprocedural states: Secondary | ICD-10-CM

## 2015-07-29 MED ORDER — PROGESTERONE MICRONIZED 200 MG PO CAPS: ORAL_CAPSULE | ORAL | Status: DC

## 2015-07-29 NOTE — Progress Notes (Signed)
Patient ID: Kristen Mullins, female   DOB: 15-Sep-1975, 40 y.o.   MRN: [id number]  HPI: Patient returns for routine postoperative follow-up having undergone TAHBSO on 07/21/2015.  The patient's immediate postoperative recovery has been unremarkable. Since hospital discharge the patient reports slow bowel function no emesis, diminished appearance.   Current Outpatient Prescriptions: acetaminophen (TYLENOL) 500 MG tablet, Take 1,000 mg by mouth every 4 (four) hours as needed for mild pain, moderate pain or headache. Reported on 05/13/2015, Disp: , Rfl:  ALPRAZolam (XANAX) 0.5 MG tablet, Take 1 tab QHS and additional 1/2 tab daily as needed (Patient taking differently: Take 0.5 mg by mouth at bedtime. Take 1 tab QHS and additional 1/2 tab daily as needed), Disp: 45 tablet, Rfl: 4  No current facility-administered medications for this visit.    Blood pressure 90/70, pulse 76, weight 116 lb 6.4 oz (52.799 kg), last menstrual period 11/26/2014.  Physical Exam: Incision clean dry intact Abdomen soft normal post op  Diagnostic Tests:   Pathology: benign  Impression: S/p TAHBSO  Plan: prometrium 200 qhs  Follow up: 4  weeks  Florian Buff, MD

## 2015-08-09 ENCOUNTER — Ambulatory Visit: Payer: 59

## 2015-08-26 ENCOUNTER — Ambulatory Visit (INDEPENDENT_AMBULATORY_CARE_PROVIDER_SITE_OTHER): Payer: 59 | Admitting: Obstetrics & Gynecology

## 2015-08-26 ENCOUNTER — Encounter: Payer: Self-pay | Admitting: Obstetrics & Gynecology

## 2015-08-26 VITALS — Wt 118.0 lb

## 2015-08-26 DIAGNOSIS — Z9889 Other specified postprocedural states: Secondary | ICD-10-CM

## 2015-08-26 NOTE — Progress Notes (Signed)
Patient ID: Kristen Mullins, female   DOB: 25-Nov-1975, 40 y.o.   MRN: [id number] Chief Complaint  Patient presents with  . Routine Post Op    Weight 118 lb (53.524 kg), last menstrual period 11/26/2014.  Incision clean dry intact Remainder of surgical glue is removed manually  Recommend go back to work in 2 weeks Resume sex in 2 weeks  Follow up in 1 year

## 2015-09-06 ENCOUNTER — Encounter (HOSPITAL_COMMUNITY): Payer: Self-pay | Admitting: Obstetrics & Gynecology

## 2015-10-14 ENCOUNTER — Encounter: Payer: Self-pay | Admitting: Family

## 2015-10-14 ENCOUNTER — Ambulatory Visit (INDEPENDENT_AMBULATORY_CARE_PROVIDER_SITE_OTHER): Payer: 59 | Admitting: Family

## 2015-10-14 VITALS — BP 90/64 | HR 78 | Temp 98.4°F | Ht 66.0 in | Wt 118.0 lb

## 2015-10-14 DIAGNOSIS — F411 Generalized anxiety disorder: Secondary | ICD-10-CM | POA: Diagnosis not present

## 2015-10-14 DIAGNOSIS — Z1239 Encounter for other screening for malignant neoplasm of breast: Secondary | ICD-10-CM

## 2015-10-14 MED ORDER — ALPRAZOLAM 0.5 MG PO TABS: 0.5000 mg | ORAL_TABLET | Freq: Every day | ORAL | Status: DC

## 2015-10-14 MED ORDER — TRIAMCINOLONE ACETONIDE 0.5 % EX OINT: 1.0000 "application " | TOPICAL_OINTMENT | Freq: Two times a day (BID) | CUTANEOUS | Status: DC

## 2015-10-14 NOTE — Progress Notes (Signed)
   Subjective:    Patient ID: Kristen Mullins, female    DOB: 1975-06-17, 40 y.o.   MRN: [id number]  Pt presents to the office today for chronic follow up.  Anxiety Presents for follow-up visit. Onset was 1 to 6 months ago. The problem has been waxing and waning. Symptoms include depressed mood, excessive worry, insomnia and nervous/anxious behavior. Patient reports no chest pain, irritability, muscle tension, palpitations, panic, restlessness or shortness of breath. Symptoms occur occasionally. The symptoms are aggravated by family issues (Father died).   Her past medical history is significant for anxiety/panic attacks and depression. Past treatments include benzodiazephines. Compliance with prior treatments has been good.      Review of Systems  Constitutional: Negative.  Negative for irritability.  HENT: Negative.   Eyes: Negative.   Respiratory: Negative.  Negative for shortness of breath.   Cardiovascular: Negative.  Negative for chest pain and palpitations.  Gastrointestinal: Negative.   Endocrine: Negative.   Genitourinary: Negative.   Musculoskeletal: Negative.   Neurological: Negative.  Negative for headaches.  Hematological: Negative.   Psychiatric/Behavioral: The patient is nervous/anxious and has insomnia.   All other systems reviewed and are negative.      Objective:   Physical Exam  Constitutional: She is oriented to person, place, and time. She appears well-developed and well-nourished. No distress.  HENT:  Head: Normocephalic and atraumatic.  Right Ear: External ear normal.  Left Ear: External ear normal.  Nose: Nose normal.  Mouth/Throat: Oropharynx is clear and moist.  Eyes: Pupils are equal, round, and reactive to light.  Neck: Normal range of motion. Neck supple. No thyromegaly present.  Cardiovascular: Normal rate, regular rhythm, normal heart sounds and intact distal pulses.   No murmur heard. Pulmonary/Chest: Effort normal and breath sounds normal. No  respiratory distress. She has no wheezes.  Abdominal: Soft. Bowel sounds are normal. She exhibits no distension. There is no tenderness.  Musculoskeletal: Normal range of motion. She exhibits no edema or tenderness.  Neurological: She is alert and oriented to person, place, and time. She has normal reflexes. No cranial nerve deficit.  Skin: Skin is warm and dry.  Psychiatric: She has a normal mood and affect. Her behavior is normal. Judgment and thought content normal.  Vitals reviewed.   BP 90/64 mmHg  Pulse 78  Temp(Src) 98.4 F (36.9 C) (Oral)  Ht _0  (1.676 m)  Wt 118 lb (53.524 kg)  BMI 19.05 kg/m2  LMP 11/26/2014       Assessment & Plan:  1. Generalized anxiety disorder - ALPRAZolam (XANAX) 0.5 MG tablet; Take 1 tablet (0.5 mg total) by mouth at bedtime. Take 1 tab QHS and additional 1/2 tab daily as needed  Dispense: 45 tablet; Refill: 4 - CMP14+EGFR  2. Breast cancer screening - [facility] - CMP14+EGFR   Continue all meds Labs pending Health Maintenance reviewed Diet and exercise encouraged RTO 6 months  Evelina Dun, FNP

## 2015-10-14 NOTE — Patient Instructions (Signed)
Health Maintenance, Female Adopting a healthy lifestyle and getting preventive care can go a long way to promote health and wellness. Talk with your health care provider about what schedule of regular examinations is right for you. This is a good chance for you to check in with your provider about disease prevention and staying healthy. In between checkups, there are plenty of things you can do on your own. Experts have done a lot of research about which lifestyle changes and preventive measures are most likely to keep you healthy. Ask your health care provider for more information. WEIGHT AND DIET  Eat a healthy diet  Be sure to include plenty of vegetables, fruits, low-fat dairy products, and lean protein.  Do not eat a lot of foods high in solid fats, added sugars, or salt.  Get regular exercise. This is one of the most important things you can do for your health.  Most adults should exercise for at least 150 minutes each week. The exercise should increase your heart rate and make you sweat (moderate-intensity exercise).  Most adults should also do strengthening exercises at least twice a week. This is in addition to the moderate-intensity exercise.  Maintain a healthy weight  Body mass index (BMI) is a measurement that can be used to identify possible weight problems. It estimates body fat based on height and weight. Your health care provider can help determine your BMI and help you achieve or maintain a healthy weight.  For females 20 years of age and older:   A BMI below 18.5 is considered underweight.  A BMI of 18.5 to 24.9 is normal.  A BMI of 25 to 29.9 is considered overweight.  A BMI of 30 and above is considered obese.  Watch levels of cholesterol and blood lipids  You should start having your blood tested for lipids and cholesterol at 41 years of age, then have this test every 5 years.  You may need to have your cholesterol levels checked more often if:  Your lipid  or cholesterol levels are high.  You are older than 41 years of age.  You are at high risk for heart disease.  CANCER SCREENING   Lung Cancer  Lung cancer screening is recommended for adults 55-80 years old who are at high risk for lung cancer because of a history of smoking.  A yearly low-dose CT scan of the lungs is recommended for people who:  Currently smoke.  Have quit within the past 15 years.  Have at least a 30-pack-year history of smoking. A pack year is smoking an average of one pack of cigarettes a day for 1 year.  Yearly screening should continue until it has been 15 years since you quit.  Yearly screening should stop if you develop a health problem that would prevent you from having lung cancer treatment.  Breast Cancer  Practice breast self-awareness. This means understanding how your breasts normally appear and feel.  It also means doing regular breast self-exams. Let your health care provider know about any changes, no matter how small.  If you are in your 20s or 30s, you should have a clinical breast exam (CBE) by a health care provider every 1-3 years as part of a regular health exam.  If you are 40 or older, have a CBE every year. Also consider having a breast X-ray (mammogram) every year.  If you have a family history of breast cancer, talk to your health care provider about genetic screening.  If you   are at high risk for breast cancer, talk to your health care provider about having an MRI and a mammogram every year.  Breast cancer gene (BRCA) assessment is recommended for women who have family members with BRCA-related cancers. BRCA-related cancers include:  Breast.  Ovarian.  Tubal.  Peritoneal cancers.  Results of the assessment will determine the need for genetic counseling and BRCA1 and BRCA2 testing. Cervical Cancer Your health care provider may recommend that you be screened regularly for cancer of the pelvic organs (ovaries, uterus, and  vagina). This screening involves a pelvic examination, including checking for microscopic changes to the surface of your cervix (Pap test). You may be encouraged to have this screening done every 3 years, beginning at age 21.  For women ages 30-65, health care providers may recommend pelvic exams and Pap testing every 3 years, or they may recommend the Pap and pelvic exam, combined with testing for human papilloma virus (HPV), every 5 years. Some types of HPV increase your risk of cervical cancer. Testing for HPV may also be done on women of any age with unclear Pap test results.  Other health care providers may not recommend any screening for nonpregnant women who are considered low risk for pelvic cancer and who do not have symptoms. Ask your health care provider if a screening pelvic exam is right for you.  If you have had past treatment for cervical cancer or a condition that could lead to cancer, you need Pap tests and screening for cancer for at least 20 years after your treatment. If Pap tests have been discontinued, your risk factors (such as having a new sexual partner) need to be reassessed to determine if screening should resume. Some women have medical problems that increase the chance of getting cervical cancer. In these cases, your health care provider may recommend more frequent screening and Pap tests. Colorectal Cancer  This type of cancer can be detected and often prevented.  Routine colorectal cancer screening usually begins at 41 years of age and continues through 41 years of age.  Your health care provider may recommend screening at an earlier age if you have risk factors for colon cancer.  Your health care provider may also recommend using home test kits to check for hidden blood in the stool.  A small camera at the end of a tube can be used to examine your colon directly (sigmoidoscopy or colonoscopy). This is done to check for the earliest forms of colorectal  cancer.  Routine screening usually begins at age 50.  Direct examination of the colon should be repeated every 5-10 years through 41 years of age. However, you may need to be screened more often if early forms of precancerous polyps or small growths are found. Skin Cancer  Check your skin from head to toe regularly.  Tell your health care provider about any new moles or changes in moles, especially if there is a change in a mole's shape or color.  Also tell your health care provider if you have a mole that is larger than the size of a pencil eraser.  Always use sunscreen. Apply sunscreen liberally and repeatedly throughout the day.  Protect yourself by wearing long sleeves, pants, a wide-brimmed hat, and sunglasses whenever you are outside. HEART DISEASE, DIABETES, AND HIGH BLOOD PRESSURE   High blood pressure causes heart disease and increases the risk of stroke. High blood pressure is more likely to develop in:  People who have blood pressure in the high end   of the normal range (130-139/85-89 mm Hg).  People who are overweight or obese.  People who are African American.  If you are 38-23 years of age, have your blood pressure checked every 3-5 years. If you are 61 years of age or older, have your blood pressure checked every year. You should have your blood pressure measured twice--once when you are at a hospital or clinic, and once when you are not at a hospital or clinic. Record the average of the two measurements. To check your blood pressure when you are not at a hospital or clinic, you can use:  An automated blood pressure machine at a pharmacy.  A home blood pressure monitor.  If you are between 45 years and 39 years old, ask your health care provider if you should take aspirin to prevent strokes.  Have regular diabetes screenings. This involves taking a blood sample to check your fasting blood sugar level.  If you are at a normal weight and have a low risk for diabetes,  have this test once every three years after 41 years of age.  If you are overweight and have a high risk for diabetes, consider being tested at a younger age or more often. PREVENTING INFECTION  Hepatitis B  If you have a higher risk for hepatitis B, you should be screened for this virus. You are considered at high risk for hepatitis B if:  You were born in a country where hepatitis B is common. Ask your health care provider which countries are considered high risk.  Your parents were born in a high-risk country, and you have not been immunized against hepatitis B (hepatitis B vaccine).  You have HIV or AIDS.  You use needles to inject street drugs.  You live with someone who has hepatitis B.  You have had sex with someone who has hepatitis B.  You get hemodialysis treatment.  You take certain medicines for conditions, including cancer, organ transplantation, and autoimmune conditions. Hepatitis C  Blood testing is recommended for:  Everyone born from [birth year] through 1966.  Anyone with known risk factors for hepatitis C. Sexually transmitted infections (STIs)  You should be screened for sexually transmitted infections (STIs) including gonorrhea and chlamydia if:  You are sexually active and are younger than 41 years of age.  You are older than 41 years of age and your health care provider tells you that you are at risk for this type of infection.  Your sexual activity has changed since you were last screened and you are at an increased risk for chlamydia or gonorrhea. Ask your health care provider if you are at risk.  If you do not have HIV, but are at risk, it may be recommended that you take a prescription medicine daily to prevent HIV infection. This is called pre-exposure prophylaxis (PrEP). You are considered at risk if:  You are sexually active and do not regularly use condoms or know the HIV status of your partner(s).  You take drugs by injection.  You are sexually  active with a partner who has HIV. Talk with your health care provider about whether you are at high risk of being infected with HIV. If you choose to begin PrEP, you should first be tested for HIV. You should then be tested every 3 months for as long as you are taking PrEP.  PREGNANCY   If you are premenopausal and you may become pregnant, ask your health care provider about preconception counseling.  If you may  become pregnant, take 400 to 800 micrograms (mcg) of folic acid every day.  If you want to prevent pregnancy, talk to your health care provider about birth control (contraception). OSTEOPOROSIS AND MENOPAUSE   Osteoporosis is a disease in which the bones lose minerals and strength with aging. This can result in serious bone fractures. Your risk for osteoporosis can be identified using a bone density scan.  If you are 61 years of age or older, or if you are at risk for osteoporosis and fractures, ask your health care provider if you should be screened.  Ask your health care provider whether you should take a calcium or vitamin D supplement to lower your risk for osteoporosis.  Menopause may have certain physical symptoms and risks.  Hormone replacement therapy may reduce some of these symptoms and risks. Talk to your health care provider about whether hormone replacement therapy is right for you.  HOME CARE INSTRUCTIONS   Schedule regular health, dental, and eye exams.  Stay current with your immunizations.   Do not use any tobacco products including cigarettes, chewing tobacco, or electronic cigarettes.  If you are pregnant, do not drink alcohol.  If you are breastfeeding, limit how much and how often you drink alcohol.  Limit alcohol intake to no more than 1 drink per day for nonpregnant women. One drink equals 12 ounces of beer, 5 ounces of wine, or 1 ounces of hard liquor.  Do not use street drugs.  Do not share needles.  Ask your health care provider for help if  you need support or information about quitting drugs.  Tell your health care provider if you often feel depressed.  Tell your health care provider if you have ever been abused or do not feel safe at home.   This information is not intended to replace advice given to you by your health care provider. Make sure you discuss any questions you have with your health care provider.   Document Released: 11/07/2010 Document Revised: 05/15/2014 Document Reviewed: 03/26/2013 Elsevier Interactive Patient Education [employer].

## 2015-10-15 LAB — CMP14+EGFR
ALBUMIN: 4.4 g/dL (ref 3.5–5.5)
ALT: 13 IU/L (ref 0–32)
AST: 12 IU/L (ref 0–40)
Albumin/Globulin Ratio: 1.9 (ref 1.2–2.2)
Alkaline Phosphatase: 74 IU/L (ref 39–117)
BUN / CREAT RATIO: 14 (ref 9–23)
BUN: 12 mg/dL (ref 6–24)
Bilirubin Total: 0.5 mg/dL (ref 0.0–1.2)
CALCIUM: 9.7 mg/dL (ref 8.7–10.2)
CO2: 23 mmol/L (ref 18–29)
CREATININE: 0.87 mg/dL (ref 0.57–1.00)
Chloride: 103 mmol/L (ref 96–106)
GFR calc Af Amer: 96 mL/min/{1.73_m2} (ref >59)
GFR, EST NON AFRICAN AMERICAN: 84 mL/min/{1.73_m2} (ref >59)
GLOBULIN, TOTAL: 2.3 g/dL (ref 1.5–4.5)
Glucose: 92 mg/dL (ref 65–99)
Potassium: 4.5 mmol/L (ref 3.5–5.2)
SODIUM: 142 mmol/L (ref 134–144)
TOTAL PROTEIN: 6.7 g/dL (ref 6.0–8.5)

## 2015-12-22 ENCOUNTER — Encounter: Payer: Self-pay | Admitting: Obstetrics & Gynecology

## 2015-12-22 ENCOUNTER — Ambulatory Visit (INDEPENDENT_AMBULATORY_CARE_PROVIDER_SITE_OTHER): Payer: 59 | Admitting: Obstetrics & Gynecology

## 2015-12-22 VITALS — BP 110/80 | HR 76 | Ht 66.0 in | Wt 109.0 lb

## 2015-12-22 DIAGNOSIS — K589 Irritable bowel syndrome without diarrhea: Secondary | ICD-10-CM

## 2015-12-22 DIAGNOSIS — F411 Generalized anxiety disorder: Secondary | ICD-10-CM

## 2015-12-22 DIAGNOSIS — F329 Major depressive disorder, single episode, unspecified: Secondary | ICD-10-CM

## 2015-12-22 DIAGNOSIS — F32A Depression, unspecified: Secondary | ICD-10-CM

## 2015-12-22 MED ORDER — DICYCLOMINE HCL 20 MG PO TABS: 20.0000 mg | ORAL_TABLET | Freq: Three times a day (TID) | ORAL | 1 refills | Status: DC

## 2015-12-22 NOTE — Progress Notes (Signed)
      Chief Complaint  Patient presents with  . stomach up set    A lot of nausea / no appetite/ diarrhea, worse since May 2017    Blood pressure 110/80, pulse 76, height 5\' 6"  (1.676 m), weight 109 lb (49.4 kg), last menstrual period 11/26/2014.  40 y.o. [id number] Patient's last menstrual period was 11/26/2014. The current method of family planning is status post hysterectomy.  Subjective Kristen Mullins comes to the office with significant amount of GI complaints Of note she has been treated in the past in 2012 by Dr. Laural Golden for what was called irritable bowel syndrome diarrhea at that point He states since May she's been having at least 2 very loose sometimes watery bowel movements daily with lower abdominal cramping associated some upper abdominal discomfort no real reflux or acid symptoms but a lot of burping She basically states the pattern ears she does fine for breakfast and lunch and eats supper and then she gets really a panic attack with her heart racing short of breath feels jittery has to go to bed with fevers and she'll's and really just has a generalized anxiety attack and then has the diarrhea She's been tried and some psychotropic drugs in the past but has always had GI upset with him Overall she does feel better since her hysterectomy since she is not having her period and heavy bleeding and heavy cramping associated She stopped her Prometrium to see if that was causing it and indeed that has not been a contributing factor  Essentially these are symptoms that she's had in the past and she put her everything off and the possibility that was related to endometriosis but she now knows since her hysterectomy that that's not the case   Objective Vulva:  normal appearing vulva with no masses, tenderness or lesions Vagina:  normal mucosa, no discharge intact Cervix:  absent Uterus:  uterus absent Adnexa: ovaries:not present,  normal adnexa in size, nontender and no masses, no  masses  No tenderness on pelvic exam   Pertinent ROS No burning with urination, frequency or urgency No nausea, vomiting or diarrhea Nor fever chills or other constitutional symptoms   Labs or studies     Impression Diagnoses this Encounter::   ICD-9-CM ICD-10-CM   1. IBS (irritable bowel syndrome) 564.1 K58.9   2. Generalized anxiety disorder 300.02 F41.1   3. Depression 311 F32.9     Established relevant diagnosis(es):   Plan/Recommendations: Meds ordered this encounter  Medications  . dicyclomine (BENTYL) 20 MG tablet    Sig: Take 1 tablet (20 mg total) by mouth 3 (three) times daily before meals.    Dispense:  90 tablet    Refill:  1    Labs or Scans Ordered: No orders of the defined types were placed in this encounter.   Management:: I have told her to cut out dairy and try to go on a gluten-free diet I started her on Bentyl 3 times a day in advance of referring to Dr. Tera Helper just to see if that helps her before he sees her maybe that will be of benefit and that will provide information for him I'm also referring her to Dr. Letta Moynahan for her generalized anxiety disorder and depression evaluation and treatment All see her back as needed otherwise  Follow up Return if symptoms worsen or fail to improve.        All questions were answered.

## 2015-12-23 NOTE — Addendum Note (Signed)
Addended by: Doyne Keel on: 12/23/2015 08:46 AM   Modules accepted: Orders

## 2015-12-26 ENCOUNTER — Emergency Department (HOSPITAL_COMMUNITY)
Admission: EM | Admit: 2015-12-26 | Discharge: 2015-12-26 | Disposition: A | Discharge status: Home / Self Care | Payer: 59 | Attending: Emergency Medicine | Admitting: Emergency Medicine

## 2015-12-26 ENCOUNTER — Encounter (HOSPITAL_COMMUNITY): Payer: Self-pay | Admitting: Emergency Medicine

## 2015-12-26 DIAGNOSIS — R110 Nausea: Secondary | ICD-10-CM | POA: Insufficient documentation

## 2015-12-26 DIAGNOSIS — R197 Diarrhea, unspecified: Secondary | ICD-10-CM | POA: Insufficient documentation

## 2015-12-26 LAB — COMPREHENSIVE METABOLIC PANEL
ALBUMIN: 4.8 g/dL (ref 3.5–5.0)
ALK PHOS: 82 U/L (ref 38–126)
ALT: 14 U/L (ref 14–54)
AST: 22 U/L (ref 15–41)
Anion gap: 11 (ref 5–15)
BUN: 19 mg/dL (ref 6–20)
CALCIUM: 9.4 mg/dL (ref 8.9–10.3)
CO2: 22 mmol/L (ref 22–32)
CREATININE: 0.93 mg/dL (ref 0.44–1.00)
Chloride: 104 mmol/L (ref 101–111)
GFR calc Af Amer: >60 mL/min (ref >60)
GFR calc non Af Amer: >60 mL/min (ref >60)
GLUCOSE: 145 mg/dL — AB (ref 65–99)
Potassium: 4 mmol/L (ref 3.5–5.1)
SODIUM: 137 mmol/L (ref 135–145)
Total Bilirubin: 1.2 mg/dL (ref 0.3–1.2)
Total Protein: 8.5 g/dL — ABNORMAL HIGH (ref 6.5–8.1)

## 2015-12-26 LAB — CBC
HCT: 44.5 % (ref 36.0–46.0)
Hemoglobin: 15.3 g/dL — ABNORMAL HIGH (ref 12.0–15.0)
MCH: 29.3 pg (ref 26.0–34.0)
MCHC: 34.4 g/dL (ref 30.0–36.0)
MCV: 85.2 fL (ref 78.0–100.0)
PLATELETS: 237 10*3/uL (ref 150–400)
RBC: 5.22 MIL/uL — ABNORMAL HIGH (ref 3.87–5.11)
RDW: 12.2 % (ref 11.5–15.5)
WBC: 8 10*3/uL (ref 4.0–10.5)

## 2015-12-26 LAB — URINE MICROSCOPIC-ADD ON

## 2015-12-26 LAB — URINALYSIS, ROUTINE W REFLEX MICROSCOPIC
GLUCOSE, UA: NEGATIVE mg/dL
Ketones, ur: 40 mg/dL — AB
Nitrite: NEGATIVE
PROTEIN: NEGATIVE mg/dL
pH: 5.5 (ref 5.0–8.0)

## 2015-12-26 LAB — LIPASE, BLOOD: Lipase: 27 U/L (ref 11–51)

## 2015-12-26 MED ORDER — SODIUM CHLORIDE 0.9 % IV BOLUS (SEPSIS)
1000.0000 mL | Freq: Once | INTRAVENOUS | Status: AC
  Administered 2015-12-26: 1000 mL via INTRAVENOUS

## 2015-12-26 MED ORDER — PROMETHAZINE HCL 25 MG/ML IJ SOLN
12.5000 mg | Freq: Once | INTRAMUSCULAR | Status: AC
  Administered 2015-12-26: 12.5 mg via INTRAVENOUS
  Filled 2015-12-26: qty 1

## 2015-12-26 MED ORDER — PROMETHAZINE HCL 25 MG PO TABS: 25.0000 mg | ORAL_TABLET | Freq: Four times a day (QID) | ORAL | 0 refills | Status: DC | PRN

## 2015-12-26 MED ORDER — ONDANSETRON HCL 4 MG/2ML IJ SOLN
4.0000 mg | Freq: Once | INTRAMUSCULAR | Status: AC
  Administered 2015-12-26: 4 mg via INTRAVENOUS
  Filled 2015-12-26: qty 2

## 2015-12-26 NOTE — ED Triage Notes (Signed)
Pt reports nausea with diarrhea, denies vomiting x4 days.  PT feels like heart is racing.  PT hasnt been able to eat well, no abdominal pain at this time. No cp.

## 2015-12-26 NOTE — ED Notes (Signed)
MD at bedside. 

## 2015-12-26 NOTE — ED Provider Notes (Signed)
[facility] DEPT Provider Note   CSN: [id number] Arrival date & time: 12/26/15  1458     History   Chief Complaint Chief Complaint  Patient presents with  . Nausea    HPI Kristen Mullins is a 40 y.o. female presenting with a chief complaint of nausea and diarrhea. She states she goes through these episodes multiple times since 2012. Has seen a gastroenterologist, Dr. Laural Golden, who has given her a tentative diagnosis of IBS. She got it might be related to her endometriosis and had a hysterectomy in May 2017. However she is still having these episodes. This week it has been worse than typical. She does not throw up but is severely nauseated and is having multiple loose watery bowel movements. No blood. Has been gradually losing weight. Feels and abdominal soreness but no real pain. Also has been having palpitations intermittently that she's not sure if these are anxiety related or from something else. No chest pain or shortness of breath. Has been taking a half tablet of Phenergan with no relief.  HPI  Past Medical History:  Diagnosis Date  . Abdominal spasms   . Anemia    HX  . Anxiety    panic attacks  . Chronic diarrhea    Hx - IBS diet controlled  . Depression    no meds  . Endometriosis   . External hemorrhoids   . Nausea    History    Patient Active Problem List   Diagnosis Date Noted  . S/P total hysterectomy and bilateral salpingo-oophorectomy 07/21/2015  . Generalized anxiety disorder 12/08/2013    Past Surgical History:  Procedure Laterality Date  . ABDOMINAL HYSTERECTOMY N/A 07/21/2015   Procedure: HYSTERECTOMY ABDOMINAL;  Surgeon: Florian Buff, MD;  Location: [facility];  Service: Gynecology;  Laterality: N/A;  . COLONOSCOPY  08/25/2010  . diagnosis    . LAPAROSCOPY N/A 06/23/2013   Procedure: LAPAROSCOPY OPERATIVE AND FULGRATION OF ENDOMETRIOSIS;  Surgeon: Marylynn Pearson, MD;  Location: [facility];  Service: Gynecology;  Laterality: N/A;  cervical laceration repair     . SALPINGOOPHORECTOMY Bilateral 07/21/2015   Procedure: SALPINGO OOPHORECTOMY;  Surgeon: Florian Buff, MD;  Location: [facility];  Service: Gynecology;  Laterality: Bilateral;  . WISDOM TOOTH EXTRACTION      OB History    Gravida Para Term Preterm AB Living   0 0 0 0 0 0   SAB TAB Ectopic Multiple Live Births   0 0 0 0         Home Medications    Prior to Admission medications   Medication Sig Start Date End Date Taking? Authorizing Provider  acetaminophen (TYLENOL) 500 MG tablet Take 1,000 mg by mouth every 4 (four) hours as needed for mild pain, moderate pain or headache. Reported on 05/13/2015   Yes Historical Provider, MD  ALPRAZolam Duanne Moron) 0.5 MG tablet Take 1 tablet (0.5 mg total) by mouth at bedtime. Take 1 tab QHS and additional 1/2 tab daily as needed 10/14/15  Yes Sharion Balloon, FNP  dicyclomine (BENTYL) 20 MG tablet Take 1 tablet (20 mg total) by mouth 3 (three) times daily before meals. Patient taking differently: Take 10-20 mg by mouth 3 (three) times daily before meals.  12/22/15  Yes Florian Buff, MD  ibuprofen (ADVIL,MOTRIN) 200 MG tablet Take 200 mg by mouth every 6 (six) hours as needed for mild pain or moderate pain.   Yes Historical Provider, MD  progesterone (PROMETRIUM) 200 MG capsule Take 1 capsule at  bedtime Patient not taking: Reported on 10/14/2015 07/29/15   Florian Buff, MD  promethazine (PHENERGAN) 25 MG tablet Take 1 tablet (25 mg total) by mouth every 6 (six) hours as needed for nausea or vomiting. 12/26/15   Sherwood Gambler, MD  triamcinolone ointment (KENALOG) 0.5 % Apply 1 application topically 2 (two) times daily. Patient not taking: Reported on 12/22/2015 10/14/15   Sharion Balloon, FNP    Family History Family History  Problem Relation Age of Onset  . Diabetes Father   . Cancer Father     lung,skin    Social History Social History  Substance Use Topics  . Smoking status: Never Smoker  . Smokeless tobacco: Never Used  . Alcohol use No      Allergies   Hydrocodone   Review of Systems Review of Systems  Constitutional: Positive for unexpected weight change. Negative for fever.  Respiratory: Negative for shortness of breath.   Cardiovascular: Positive for palpitations. Negative for chest pain.  Gastrointestinal: Positive for diarrhea and nausea. Negative for abdominal pain, blood in stool and vomiting.  Genitourinary: Negative for dysuria.  All other systems reviewed and are negative.    Physical Exam Updated Vital Signs BP 109/71   Pulse 85   Temp 98.2 F (36.8 C) (Oral)   Resp 21   Ht 5\' 6"  (1.676 m)   Wt 110 lb (49.9 kg)   LMP 11/26/2014   SpO2 98%   BMI 17.75 kg/m   Physical Exam  Constitutional: She is oriented to person, place, and time. She appears well-developed and well-nourished. No distress.  HENT:  Head: Normocephalic and atraumatic.  Right Ear: External ear normal.  Left Ear: External ear normal.  Nose: Nose normal.  Mouth/Throat: Oropharynx is clear and moist.  Eyes: Right eye exhibits no discharge. Left eye exhibits no discharge.  Cardiovascular: Normal rate, regular rhythm and normal heart sounds.   No murmur heard. Pulmonary/Chest: Effort normal and breath sounds normal.  Abdominal: Soft. She exhibits no distension. There is no tenderness.  Neurological: She is alert and oriented to person, place, and time.  Skin: Skin is warm and dry. She is not diaphoretic.  Nursing note and vitals reviewed.    ED Treatments / Results  Labs (all labs ordered are listed, but only abnormal results are displayed) Labs Reviewed  COMPREHENSIVE METABOLIC PANEL - Abnormal; Notable for the following:       Result Value   Glucose, Bld 145 (*)    Total Protein 8.5 (*)    All other components within normal limits  CBC - Abnormal; Notable for the following:    RBC 5.22 (*)    Hemoglobin 15.3 (*)    All other components within normal limits  URINALYSIS, ROUTINE W REFLEX MICROSCOPIC (NOT AT [facility]) -  Abnormal; Notable for the following:    Specific Gravity, Urine >1.030 (*)    Hgb urine dipstick TRACE (*)    Bilirubin Urine SMALL (*)    Ketones, ur 40 (*)    Leukocytes, UA TRACE (*)    All other components within normal limits  URINE MICROSCOPIC-ADD ON - Abnormal; Notable for the following:    Squamous Epithelial / LPF 0-5 (*)    Bacteria, UA FEW (*)    All other components within normal limits  URINE CULTURE  LIPASE, BLOOD    EKG  EKG Interpretation  Date/Time:  Sunday December 26 2015 15:08:33 EDT Ventricular Rate:  99 PR Interval:    QRS Duration: 84 QT  Interval:  347 QTC Calculation: 446 R Axis:   95 Text Interpretation:  Normal sinus rhythm Right atrial enlargement Consider right ventricular hypertrophy Baseline wander in lead(s) III no significant change since 2012 Confirmed by Midas Daughety MD, Johnney Scarlata [phone]) on 12/26/2015 3:12:34 PM       Radiology No results found.  Procedures Procedures (including critical care time)  Medications Ordered in ED Medications  ondansetron (ZOFRAN) injection 4 mg (4 mg Intravenous Given 12/26/15 1538)  sodium chloride 0.9 % bolus 1,000 mL (0 mLs Intravenous Stopped 12/26/15 1642)  promethazine (PHENERGAN) injection 12.5 mg (12.5 mg Intravenous Given 12/26/15 1714)  sodium chloride 0.9 % bolus 1,000 mL (1,000 mLs Intravenous New Bag/Given 12/26/15 1713)     Initial Impression / Assessment and Plan / ED Course  I have reviewed the triage vital signs and the nursing notes.  Pertinent labs & imaging results that were available during my care of the patient were reviewed by me and considered in my medical decision making (see chart for details).  Clinical Course  Comment By Time  She is in sinus rhythm. No acute ECG changes. No ACS symptoms. Will treat nausea, give fluids, and check labs including renal function and electrolytes. No abd tenderness, I do not think abdominal imaging would be helpful at this time. Sherwood Gambler, MD 08/20 1529   Bloodwork unremarkable. Feels no improvement with zofran. Will try phenergan. Urine pending. Sherwood Gambler, MD 08/20 1644  Patient feels much better after Phenergan. Tolerating oral fluids. Trace leukocytes in urine but she has no urinary symptoms. Will send for culture but I do not think this represents UTI. Will give her a few more Phenergan at home and discussed close follow-up with her gastroenterologist. Discussed return precautions. Sherwood Gambler, MD 08/20 1821      Final Clinical Impressions(s) / ED Diagnoses   Final diagnoses:  Nausea in adult    New Prescriptions New Prescriptions   PROMETHAZINE (PHENERGAN) 25 MG TABLET    Take 1 tablet (25 mg total) by mouth every 6 (six) hours as needed for nausea or vomiting.     Sherwood Gambler, MD 12/26/15 [phone]

## 2015-12-27 ENCOUNTER — Ambulatory Visit (INDEPENDENT_AMBULATORY_CARE_PROVIDER_SITE_OTHER): Payer: 59 | Admitting: Family Medicine

## 2015-12-27 ENCOUNTER — Encounter: Payer: Self-pay | Admitting: Family Medicine

## 2015-12-27 VITALS — BP 95/64 | HR 83 | Temp 98.0°F | Ht 66.0 in | Wt 109.2 lb

## 2015-12-27 DIAGNOSIS — R197 Diarrhea, unspecified: Secondary | ICD-10-CM

## 2015-12-27 MED ORDER — ELUXADOLINE 100 MG PO TABS: 100.0000 mg | ORAL_TABLET | Freq: Two times a day (BID) | ORAL | 0 refills | Status: DC

## 2015-12-27 NOTE — Progress Notes (Signed)
   HPI  Patient presents today here with nausea and diarrhea.    Patient's lines of over last 3 months she's been having progressive worsening of what was previously diagnosed as IBS-diarrheal type.  Patient's lines that she has episodes, previously only 2-3 times a month, now happening 2-3 times a week, of nausea, diarrhea, and loss of appetite that happen after eating her evening meal. She states that the episode glasses 4-6 hours, she has several episodes of diarrhea that is profuse and watery. No blood in the diarrhea.  She has intermittent nausea and very infrequent emesis.  She denies any abdominal pain.  She states that she was diagnosed with IBS previously, she's tried Bentyl which did not agree with her and that she cannot tolerate. She had no improvement of symptoms.  She has referral to  PMH: Smoking status noted ROS: Per HPI  Objective: BP 95/64   Pulse 83   Temp 98 F (36.7 C) (Oral)   Ht 5\' 6"  (1.676 m)   Wt 109 lb 3.2 oz (49.5 kg)   LMP 11/26/2014   BMI 17.63 kg/m  Gen: NAD, alert, cooperative with exam HEENT: NCAT CV: RRR, good S1/S2, no murmur Resp: CTABL, no wheezes, non-labored Abd: Positive bowel sounds, soft, thin, mild tenderness to palpation throughout but no discrete areas of tenderness. Ext: No edema, warm Neuro: Alert and oriented, No gross deficits  Assessment and plan:  # Diarrhea, nausea.  Likely IBS-diarrheal type Recommended calling and asking for soonest available appointment by her GI office. Labs have been reviewed from the emergency room and were normal. Urine cultures pending. Trial of viberzi Return to clinic in 3-4 weeks unless she has good follow-up with GI about that point. Also ordered ultrasound of the abdomen given her diffuse abdominal tenderness, although mild, on my exam today.   Orders Placed This Encounter  Procedures  . US Abdomen Complete    Standing Status:   Future    Standing Expiration Date:   02/25/2017    Order Specific Question:   Reason for Exam (SYMPTOM  OR DIAGNOSIS REQUIRED)    Answer:   severe nausea and diarrhea related to food    Order Specific Question:   Preferred imaging location?    Answer:   [facility]    Meds ordered this encounter  Medications  . Eluxadoline (VIBERZI) 100 MG TABS    Sig: Take 100 mg by mouth 2 (two) times daily.    Dispense:  60 tablet    Refill:  0    Laroy Apple, MD [facility] 12/27/2015, 1:21 PM

## 2015-12-27 NOTE — Patient Instructions (Signed)
Great to meet you!  Try Viberzi, 1 pill twice daily.   Come back in 3-4 weeks We will work on scheduling an ultrasound for you .

## 2015-12-28 ENCOUNTER — Encounter (INDEPENDENT_AMBULATORY_CARE_PROVIDER_SITE_OTHER): Payer: Self-pay | Admitting: Internal Medicine

## 2015-12-29 ENCOUNTER — Encounter: Payer: 59 | Admitting: *Deleted

## 2015-12-29 LAB — URINE CULTURE

## 2015-12-31 ENCOUNTER — Ambulatory Visit (HOSPITAL_COMMUNITY)
Admission: RE | Admit: 2015-12-31 | Discharge: 2015-12-31 | Disposition: A | Discharge status: Home / Self Care | Payer: 59 | Source: Ambulatory Visit | Attending: Family Medicine | Admitting: Family Medicine

## 2015-12-31 DIAGNOSIS — R197 Diarrhea, unspecified: Secondary | ICD-10-CM | POA: Insufficient documentation

## 2016-01-11 ENCOUNTER — Encounter (INDEPENDENT_AMBULATORY_CARE_PROVIDER_SITE_OTHER): Payer: Self-pay | Admitting: Internal Medicine

## 2016-01-11 ENCOUNTER — Encounter (INDEPENDENT_AMBULATORY_CARE_PROVIDER_SITE_OTHER): Payer: Self-pay | Admitting: *Deleted

## 2016-01-11 ENCOUNTER — Ambulatory Visit (INDEPENDENT_AMBULATORY_CARE_PROVIDER_SITE_OTHER): Payer: 59 | Admitting: Internal Medicine

## 2016-01-11 VITALS — BP 90/65 | HR 72 | Temp 98.0°F | Ht 66.0 in | Wt 110.2 lb

## 2016-01-11 DIAGNOSIS — R110 Nausea: Secondary | ICD-10-CM

## 2016-01-11 DIAGNOSIS — K589 Irritable bowel syndrome without diarrhea: Secondary | ICD-10-CM | POA: Insufficient documentation

## 2016-01-11 HISTORY — DX: Irritable bowel syndrome, unspecified: K58.9

## 2016-01-11 NOTE — Progress Notes (Addendum)
Subjective:    Patient ID: Kristen Mullins, female    DOB: 24-May-1975, 40 y.o.   MRN: [id number]  HPI Referred by Dr. Kenn File [facility])  for IBS.  She tells me her symptoms started 5 yrs ago with nausea and diarrhea. She had a colonoscopy in 2012 which was normal. She was diagnosed with endometriosis 5 yrs ago and underwent a complete hysterectomy. Her symptoms have not improved. Since May, she says her nausea has been worse. The nausea is worse at night. If she eats more than a hand full she becomes nauseated and feels shaky. Symptoms will last for about 3 hrs. She says the diarrhea is constant. For the past 3 weeks she has had diarrhea, but for the last 3 days, her stools have been normal.    She usually has one stool a day. If she has nausea, she will have 2 stools a day. If her bladder becomes to full she has nausea.  No melena or BRRB.  Her appetite has not good since May.  She says she has lost about 20 pounds. She has been under a lot of stress.  If she becomes stressed, she will have diarrhea. She tried the Bentyl but the medication  caused her heart to race. She became shaky and sweaty.  Her last colonoscopy was in April of 2012: Terminal ileum was normal. Colonic mucosa was also normal except she had a single cecal diverticulum.  External hemorrhoids. Biopsy: negative for microscopic and/or collagenous colitis.   08/08/2010 HIDA scan:  Clinical Data:  Nausea and diarrhea Impression : Normal.  12/31/2015 US abdomen complete:   Study Result   CLINICAL DATA:  Severe nausea, diarrhea, symptoms felt to be related to food. Diffuse abdominal tenderness ; intermittent episodes of the symptoms since 20/12; history of irritable bowel syndrome.  Impression: Normal  CBC    Component Value Date/Time   WBC 8.0 12/26/2015 1508   RBC 5.22 (H) 12/26/2015 1508   HGB 15.3 (H) 12/26/2015 1508   HCT 44.5 12/26/2015 1508   PLT 237 12/26/2015 1508   MCV 85.2 12/26/2015 1508   MCV 87.4 03/24/2014 1650   MCH 29.3 12/26/2015 1508   MCHC 34.4 12/26/2015 1508   RDW 12.2 12/26/2015 1508   LYMPHSABS 0.8 07/30/2010 1040   MONOABS 0.3 07/30/2010 1040   EOSABS 0.1 07/30/2010 1040   BASOSABS 0.0 07/30/2010 1040   Lipase     Component Value Date/Time   LIPASE 27 12/26/2015 1508   CMP     Component Value Date/Time   NA 137 12/26/2015 1508   NA 142 10/14/2015 1002   K 4.0 12/26/2015 1508   CL 104 12/26/2015 1508   CO2 22 12/26/2015 1508   GLUCOSE 145 (H) 12/26/2015 1508   BUN 19 12/26/2015 1508   BUN 12 10/14/2015 1002   CREATININE 0.93 12/26/2015 1508   CREATININE 0.84 10/28/2012 1449   CALCIUM 9.4 12/26/2015 1508   PROT 8.5 (H) 12/26/2015 1508   PROT 6.7 10/14/2015 1002   ALBUMIN 4.8 12/26/2015 1508   ALBUMIN 4.4 10/14/2015 1002   AST 22 12/26/2015 1508   ALT 14 12/26/2015 1508   ALKPHOS 82 12/26/2015 1508   BILITOT 1.2 12/26/2015 1508   BILITOT 0.5 10/14/2015 1002   GFRNONAA >60 12/26/2015 1508   GFRNONAA 89 10/28/2012 1449   GFRAA >60 12/26/2015 1508   GFRAA >89 10/28/2012 1449        Review of Systems Past Medical History:  Diagnosis Date  .  Abdominal spasms   . Anemia    HX  . Anxiety    panic attacks  . Chronic diarrhea    Hx - IBS diet controlled  . Depression    no meds  . Endometriosis   . External hemorrhoids   . IBS (irritable bowel syndrome) 01/11/2016  . Nausea    History    Past Surgical History:  Procedure Laterality Date  . ABDOMINAL HYSTERECTOMY N/A 07/21/2015   Procedure: HYSTERECTOMY ABDOMINAL;  Surgeon: Florian Buff, MD;  Location: [facility];  Service: Gynecology;  Laterality: N/A;  . COLONOSCOPY  08/25/2010  . diagnosis    . LAPAROSCOPY N/A 06/23/2013   Procedure: LAPAROSCOPY OPERATIVE AND FULGRATION OF ENDOMETRIOSIS;  Surgeon: Marylynn Pearson, MD;  Location: [facility];  Service: Gynecology;  Laterality: N/A;  cervical laceration repair   . SALPINGOOPHORECTOMY Bilateral 07/21/2015   Procedure: SALPINGO OOPHORECTOMY;   Surgeon: Florian Buff, MD;  Location: [facility];  Service: Gynecology;  Laterality: Bilateral;  . WISDOM TOOTH EXTRACTION      Allergies  Allergen Reactions  . Bentyl [Dicyclomine Hcl] Nausea Only  . Hydrocodone Nausea And Vomiting    Current Outpatient Prescriptions on File Prior to Visit  Medication Sig Dispense Refill  . acetaminophen (TYLENOL) 500 MG tablet Take 1,000 mg by mouth every 4 (four) hours as needed for mild pain, moderate pain or headache. Reported on 05/13/2015    . ALPRAZolam (XANAX) 0.5 MG tablet Take 1 tablet (0.5 mg total) by mouth at bedtime. Take 1 tab QHS and additional 1/2 tab daily as needed 45 tablet 4  . promethazine (PHENERGAN) 25 MG tablet Take 1 tablet (25 mg total) by mouth every 6 (six) hours as needed for nausea or vomiting. 10 tablet 0  . triamcinolone ointment (KENALOG) 0.5 % Apply 1 application topically 2 (two) times daily. (Patient taking differently: Apply 1 application topically as needed. ) 60 g 3  . Eluxadoline (VIBERZI) 100 MG TABS Take 100 mg by mouth 2 (two) times daily. (Patient not taking: Reported on 01/11/2016) 60 tablet 0   No current facility-administered medications on file prior to visit.        Objective:   Physical Exam Blood pressure 90/65, pulse 72, temperature 98 F (36.7 C), height 5\' 6"  (1.676 m), weight 110 lb 3.2 oz (50 kg), last menstrual period 11/26/2014. Alert and oriented. Skin warm and dry. Oral mucosa is moist.   . Sclera anicteric, conjunctivae is pink. Thyroid not enlarged. No cervical lymphadenopathy. Lungs clear. Heart regular rate and rhythm.  Abdomen is soft. Bowel sounds are positive. No hepatomegaly. No abdominal masses felt. No tenderness.  No edema to lower extremities.          Assessment & Plan:  Probable IBS. Nausea ? Gastroparesis. Will get an emptying study. Fiber 4 gms. Further recommendations to follow.

## 2016-01-11 NOTE — Patient Instructions (Signed)
Fiber 4 gm.  Po. Emptying study.

## 2016-01-13 ENCOUNTER — Encounter (HOSPITAL_COMMUNITY): Payer: Self-pay

## 2016-01-13 ENCOUNTER — Encounter (HOSPITAL_COMMUNITY)
Admission: RE | Admit: 2016-01-13 | Discharge: 2016-01-13 | Disposition: A | Discharge status: Home / Self Care | Payer: 59 | Source: Ambulatory Visit | Attending: Internal Medicine | Admitting: Internal Medicine

## 2016-01-13 DIAGNOSIS — R110 Nausea: Secondary | ICD-10-CM | POA: Insufficient documentation

## 2016-01-13 DIAGNOSIS — K589 Irritable bowel syndrome without diarrhea: Secondary | ICD-10-CM | POA: Diagnosis not present

## 2016-01-13 MED ORDER — TECHNETIUM TC 99M SULFUR COLLOID
2.0000 | Freq: Once | INTRAVENOUS | Status: AC | PRN
  Administered 2016-01-13: 2 via ORAL

## 2016-01-17 ENCOUNTER — Telehealth (INDEPENDENT_AMBULATORY_CARE_PROVIDER_SITE_OTHER): Payer: Self-pay | Admitting: Internal Medicine

## 2016-01-17 NOTE — Telephone Encounter (Signed)
Patient called, stated she was calling [name] per [name]'s request.  [phone]

## 2016-01-17 NOTE — Telephone Encounter (Signed)
Noted  

## 2016-01-25 ENCOUNTER — Other Ambulatory Visit (INDEPENDENT_AMBULATORY_CARE_PROVIDER_SITE_OTHER): Payer: Self-pay | Admitting: Internal Medicine

## 2016-01-25 ENCOUNTER — Telehealth (INDEPENDENT_AMBULATORY_CARE_PROVIDER_SITE_OTHER): Payer: Self-pay | Admitting: Internal Medicine

## 2016-01-25 DIAGNOSIS — R110 Nausea: Secondary | ICD-10-CM | POA: Insufficient documentation

## 2016-01-25 NOTE — Telephone Encounter (Signed)
EGD sch'd 01/27/16 at 930 (830), patient aware

## 2016-01-25 NOTE — Telephone Encounter (Signed)
Kristen Mullins, EGD with biopsy

## 2016-01-27 ENCOUNTER — Encounter (HOSPITAL_COMMUNITY): Payer: Self-pay

## 2016-01-27 ENCOUNTER — Encounter (HOSPITAL_COMMUNITY)
Admission: RE | Disposition: A | Discharge status: Home / Self Care | Payer: Self-pay | Source: Ambulatory Visit | Attending: Internal Medicine

## 2016-01-27 ENCOUNTER — Ambulatory Visit (HOSPITAL_COMMUNITY)
Admission: RE | Admit: 2016-01-27 | Discharge: 2016-01-27 | Disposition: A | Discharge status: Home / Self Care | Payer: 59 | Source: Ambulatory Visit | Attending: Internal Medicine | Admitting: Internal Medicine

## 2016-01-27 DIAGNOSIS — R634 Abnormal weight loss: Secondary | ICD-10-CM | POA: Insufficient documentation

## 2016-01-27 DIAGNOSIS — R630 Anorexia: Secondary | ICD-10-CM | POA: Insufficient documentation

## 2016-01-27 DIAGNOSIS — K2950 Unspecified chronic gastritis without bleeding: Secondary | ICD-10-CM | POA: Insufficient documentation

## 2016-01-27 DIAGNOSIS — K2970 Gastritis, unspecified, without bleeding: Secondary | ICD-10-CM | POA: Diagnosis not present

## 2016-01-27 DIAGNOSIS — R110 Nausea: Secondary | ICD-10-CM | POA: Insufficient documentation

## 2016-01-27 DIAGNOSIS — F419 Anxiety disorder, unspecified: Secondary | ICD-10-CM | POA: Insufficient documentation

## 2016-01-27 DIAGNOSIS — K3189 Other diseases of stomach and duodenum: Secondary | ICD-10-CM | POA: Diagnosis not present

## 2016-01-27 HISTORY — PX: ESOPHAGOGASTRODUODENOSCOPY: SHX5428

## 2016-01-27 HISTORY — PX: BIOPSY: SHX5522

## 2016-01-27 SURGERY — EGD (ESOPHAGOGASTRODUODENOSCOPY)
Anesthesia: Moderate Sedation

## 2016-01-27 MED ORDER — MIDAZOLAM HCL 5 MG/5ML IJ SOLN
INTRAMUSCULAR | Status: AC
  Filled 2016-01-27: qty 10

## 2016-01-27 MED ORDER — SODIUM CHLORIDE 0.9 % IV SOLN
INTRAVENOUS | Status: DC
  Administered 2016-01-27: 09:00:00 via INTRAVENOUS

## 2016-01-27 MED ORDER — BUTAMBEN-TETRACAINE-BENZOCAINE 2-2-14 % EX AERO
INHALATION_SPRAY | CUTANEOUS | Status: AC
  Filled 2016-01-27: qty 20

## 2016-01-27 MED ORDER — STERILE WATER FOR IRRIGATION IR SOLN
Status: DC | PRN
  Administered 2016-01-27: 2.5 mL

## 2016-01-27 MED ORDER — MIDAZOLAM HCL 5 MG/5ML IJ SOLN
INTRAMUSCULAR | Status: DC | PRN
  Administered 2016-01-27 (×3): 2 mg via INTRAVENOUS

## 2016-01-27 MED ORDER — MEPERIDINE HCL 50 MG/ML IJ SOLN
INTRAMUSCULAR | Status: AC
  Filled 2016-01-27: qty 1

## 2016-01-27 MED ORDER — MEPERIDINE HCL 50 MG/ML IJ SOLN
INTRAMUSCULAR | Status: DC | PRN
  Administered 2016-01-27: 20 mg via INTRAVENOUS

## 2016-01-27 MED ORDER — BUTAMBEN-TETRACAINE-BENZOCAINE 2-2-14 % EX AERO
INHALATION_SPRAY | CUTANEOUS | Status: DC | PRN
  Administered 2016-01-27: 2 via TOPICAL

## 2016-01-27 NOTE — Discharge Instructions (Signed)
Resume usual medications and diet. No driving for 24 hours. Physician will call with biopsy results and further recommendations.  Esophagogastroduodenoscopy, Care After Refer to this sheet in the next few weeks. These instructions provide you with information about caring for yourself after your procedure. Your health care provider may also give you more specific instructions. Your treatment has been planned according to current medical practices, but problems sometimes occur. Call your health care provider if you have any problems or questions after your procedure. Dr Laural Golden [phone].  After hours call the main hospital number [phone] and ask to have GI doctor paged WHAT TO [place] After your procedure, it is typical to feel:  Soreness in your throat.  Pain with swallowing.  Sick to your stomach (nauseous).  Bloated.  Dizzy.  Fatigued. HOME CARE INSTRUCTIONS  Do not eat or drink anything until the numbing medicine (local anesthetic) has worn off and your gag reflex has returned. You will know that the local anesthetic has worn off when you can swallow comfortably.  Do not drive or operate machinery until directed by your health care provider.  Take medicines only as directed by your health care provider. SEEK MEDICAL CARE IF:   You cannot stop coughing.  You are not urinating at all or less than usual. SEEK IMMEDIATE MEDICAL CARE IF:  You have difficulty swallowing.  You cannot eat or drink.  You have worsening throat or chest pain.  You have dizziness or lightheadedness or you faint.  You have nausea or vomiting.  You have chills.  You have a fever.  You have severe abdominal pain.  You have black, tarry, or bloody stools.   This information is not intended to replace advice given to you by your health care provider. Make sure you discuss any questions you have with your health care provider.   Document Released: 04/10/2012 Document  Revised: 05/15/2014 Document Reviewed: 04/10/2012 Elsevier Interactive Patient Education [employer].

## 2016-01-27 NOTE — Op Note (Addendum)
[facility] Patient Name: Kristen Mullins Procedure Date: 01/27/2016 9:06 AM MRN: [id number] Date of Birth: 30-Mar-1976 Attending MD: Hildred Laser , MD CSN: [id number] Age: 40 Admit Type: Inpatient Procedure:                Upper GI endoscopy Indications:              Anorexia, Nausea, Weight loss Providers:                Hildred Laser, MD, Lurline Del, RN, Purcell Nails. [place],                            [employer] Referring MD:             Sherley Bounds. Wendi Snipes, MD Medicines:                Cetacaine spray, Meperidine 20 mg IV, Midazolam 6                            mg IV Complications:            No immediate complications. Estimated Blood Loss:     Estimated blood loss was minimal. Procedure:                Pre-Anesthesia Assessment:                           - Prior to the procedure, a History and Physical                            was performed, and patient medications and                            allergies were reviewed. The patient's tolerance of                            previous anesthesia was also reviewed. The risks                            and benefits of the procedure and the sedation                            options and risks were discussed with the patient.                            All questions were answered, and informed consent                            was obtained. Prior Anticoagulants: The patient has                            taken no previous anticoagulant or antiplatelet                            agents. ASA Grade Assessment: II - A patient with  mild systemic disease. After reviewing the risks                            and benefits, the patient was deemed in                            satisfactory condition to undergo the procedure.                           After obtaining informed consent, the endoscope was                            passed under direct vision. Throughout the                            procedure, the patient's  blood pressure, pulse, and                            oxygen saturations were monitored continuously. The                            [id number] scope was introduced through the                            mouth, and advanced to the second part of duodenum.                            The upper GI endoscopy was accomplished without                            difficulty. The patient tolerated the procedure                            well. Scope In: 9:38:16 AM Scope Out: 9:46:33 AM Total Procedure Duration: 0 hours 8 minutes 17 seconds  Findings:      The examined esophagus was normal.      The Z-line was regular and was found 40 cm from the incisors.      Patchy mildly erythematous mucosa without bleeding was found in the       gastric antrum. Biopsies were taken with a cold forceps for histology.      The exam of the stomach was otherwise normal.      The duodenal bulb and first portion of the duodenum were normal.       Biopsies for histology were taken with a cold forceps for evaluation of       celiac disease. Impression:               - Normal esophagus.                           - Z-line regular, 40 cm from the incisors.                           - Erythematous mucosa in the antrum. Biopsied.                           -  Normal duodenal bulb and first portion of the                            duodenum. Biopsied.                           Comment: hese findings wo symptoms. Gastric                            biopsies taken check for H. pylori infection and                            eosinophilic gastritis and duodenal to rule out                            celiac disease. Moderate Sedation:      Moderate (conscious) sedation was administered by the endoscopy nurse       and supervised by the endoscopist. The following parameters were       monitored: oxygen saturation, heart rate, blood pressure, CO2       capnography and response to care. Total physician intraservice time was        15 minutes. Recommendation:           - Patient has a contact number available for                            emergencies. The signs and symptoms of potential                            delayed complications were discussed with the                            patient. Return to normal activities tomorrow.                            Written discharge instructions were provided to the                            patient.                           - Resume previous diet today.                           - Continue present medications.                           - Await pathology results. Procedure Code(s):        --- Professional ---                           [phone], Esophagogastroduodenoscopy, flexible,                            transoral; with biopsy, single or multiple  [id number], Moderate sedation services provided by the                            same physician or other qualified health care                            professional performing the diagnostic or                            therapeutic service that the sedation supports,                            requiring the presence of an independent trained                            observer to assist in the monitoring of the                            patient's level of consciousness and physiological                            status; initial 15 minutes of intraservice time,                            patient age 49 years or older Diagnosis Code(s):        --- Professional ---                           K31.89, Other diseases of stomach and duodenum                           R63.0, Anorexia                           R11.0, Nausea                           R63.4, Abnormal weight loss CPT copyright 2016 American Medical Association. All rights reserved. The codes documented in this report are preliminary and upon coder review may  be revised to meet current compliance requirements. Hildred Laser, MD Hildred Laser, MD 01/27/2016 9:56:26 AM This report has been signed electronically. Number of Addenda: 0

## 2016-01-27 NOTE — H&P (Signed)
Kristen Mullins is an 40 y.o. female.   Chief Complaint: Patient is here for diagnostic EGD. HPI: Patient is 40 year old Caucasian female who presents with chronic nausea. She also has anorexia and has lost 20-25 pounds in the last several months. Nausea is worse in the evenings and she's not able to eat. She's never vomited. She has intermittent diarrhea. She may have diarrhea once or twice a week. She denies melena or rectal bleeding. She does not take OTC NSAIDs. She does not smoke cigarettes or drink alcohol. She stays away from daily products as she feels she has lactose intolerance. Recent workup includes normal CBC and comprehensive chemistry panel except for an glucose. Ultrasound was negative for cholelithiasis and gastric emptying studies normal.  Past Medical History:  Diagnosis Date  . Abdominal spasms   . Anemia    HX  . Anxiety    panic attacks  . Cancer (HCC)    Skin  . Chronic diarrhea    Hx - IBS diet controlled  . Depression    no meds  . Endometriosis   . External hemorrhoids   . IBS (irritable bowel syndrome) 01/11/2016  . Nausea    History    Past Surgical History:  Procedure Laterality Date  . ABDOMINAL HYSTERECTOMY N/A 07/21/2015   Procedure: HYSTERECTOMY ABDOMINAL;  Surgeon: Florian Buff, MD;  Location: [facility];  Service: Gynecology;  Laterality: N/A;  . COLONOSCOPY  08/25/2010  . diagnosis    . LAPAROSCOPY N/A 06/23/2013   Procedure: LAPAROSCOPY OPERATIVE AND FULGRATION OF ENDOMETRIOSIS;  Surgeon: Marylynn Pearson, MD;  Location: [place] [facility];  Service: Gynecology;  Laterality: N/A;  cervical laceration repair   . SALPINGOOPHORECTOMY Bilateral 07/21/2015   Procedure: SALPINGO OOPHORECTOMY;  Surgeon: Florian Buff, MD;  Location: [facility];  Service: Gynecology;  Laterality: Bilateral;  . WISDOM TOOTH EXTRACTION      Family History  Problem Relation Age of Onset  . Diabetes Father   . Cancer Father     lung,skin   Social History:  reports that she has never smoked. She  has never used smokeless tobacco. She reports that she does not drink alcohol or use drugs.  Allergies:  Allergies  Allergen Reactions  . Bentyl [Dicyclomine Hcl] Nausea Only  . Hydrocodone Nausea And Vomiting    Medications Prior to Admission  Medication Sig Dispense Refill  . ALPRAZolam (XANAX) 0.5 MG tablet Take 1 tablet (0.5 mg total) by mouth at bedtime. Take 1 tab QHS and additional 1/2 tab daily as needed 45 tablet 4  . promethazine (PHENERGAN) 25 MG tablet Take 1 tablet (25 mg total) by mouth every 6 (six) hours as needed for nausea or vomiting. 10 tablet 0  . acetaminophen (TYLENOL) 500 MG tablet Take 1,000 mg by mouth every 4 (four) hours as needed for mild pain, moderate pain or headache. Reported on 05/13/2015    . Eluxadoline (VIBERZI) 100 MG TABS Take 100 mg by mouth 2 (two) times daily. (Patient not taking: Reported on 01/27/2016) 60 tablet 0  . triamcinolone ointment (KENALOG) 0.5 % Apply 1 application topically 2 (two) times daily. (Patient taking differently: Apply 1 application topically as needed. ) 60 g 3    No results found for this or any previous visit (from the past 48 hour(s)). No results found.  ROS  Blood pressure 112/82, pulse 73, temperature 98.2 F (36.8 C), temperature source Oral, resp. rate 14, last menstrual period 11/26/2014, SpO2 100 %. Physical Exam  Constitutional:  Well-developed thin  Caucasian female in NAD.  HENT:  Mouth/Throat: Oropharynx is clear and moist.  Eyes: Conjunctivae are normal. No scleral icterus.  Neck: No thyromegaly present.  Cardiovascular: Normal rate, regular rhythm and normal heart sounds.   No murmur heard. Respiratory: Effort normal and breath sounds normal.  GI:  Abdomen is symmetrical soft and nontender without organomegaly or masses.  Musculoskeletal: She exhibits no edema.  Lymphadenopathy:    She has no cervical adenopathy.  Neurological: She is alert.  Skin: Skin is warm and dry.     Assessment/Plan Nausea  anorexia and weight loss. Ultrasound negative for cholelithiasis and gastric emptying studies normal. Diagnostic EGD.  Hildred Laser, MD 01/27/2016, 9:25 AM

## 2016-02-03 ENCOUNTER — Telehealth (INDEPENDENT_AMBULATORY_CARE_PROVIDER_SITE_OTHER): Payer: Self-pay | Admitting: Internal Medicine

## 2016-02-03 NOTE — Telephone Encounter (Signed)
Dr.Rehman was given message and he will call the patient with the result of Bx.

## 2016-02-03 NOTE — Telephone Encounter (Signed)
Patient called and wanted to know if her results from a week ago are in.  She's anxious to find out the results.  [phone]

## 2016-02-07 ENCOUNTER — Encounter (HOSPITAL_COMMUNITY): Payer: Self-pay | Admitting: Internal Medicine

## 2016-02-07 ENCOUNTER — Other Ambulatory Visit (INDEPENDENT_AMBULATORY_CARE_PROVIDER_SITE_OTHER): Payer: Self-pay | Admitting: *Deleted

## 2016-02-07 DIAGNOSIS — R110 Nausea: Secondary | ICD-10-CM

## 2016-02-07 DIAGNOSIS — R634 Abnormal weight loss: Secondary | ICD-10-CM

## 2016-02-07 DIAGNOSIS — R630 Anorexia: Secondary | ICD-10-CM

## 2016-02-11 ENCOUNTER — Other Ambulatory Visit (INDEPENDENT_AMBULATORY_CARE_PROVIDER_SITE_OTHER): Payer: Self-pay | Admitting: Internal Medicine

## 2016-02-11 LAB — SEDIMENTATION RATE: SED RATE: 4 mm/h (ref 0–20)

## 2016-02-11 LAB — TSH: TSH: 1.41 m[IU]/L

## 2016-02-11 LAB — CORTISOL: CORTISOL PLASMA: 14.8 ug/dL

## 2016-02-11 MED ORDER — PROMETHAZINE HCL 25 MG PO TABS: 25.0000 mg | ORAL_TABLET | Freq: Two times a day (BID) | ORAL | 0 refills | Status: DC | PRN

## 2016-02-11 MED ORDER — ESCITALOPRAM OXALATE 10 MG PO TABS: 10.0000 mg | ORAL_TABLET | Freq: Every day | ORAL | 1 refills | Status: DC

## 2016-02-28 ENCOUNTER — Ambulatory Visit: Payer: 59 | Admitting: Obstetrics & Gynecology

## 2016-03-10 ENCOUNTER — Encounter: Payer: Self-pay | Admitting: Obstetrics & Gynecology

## 2016-03-10 ENCOUNTER — Ambulatory Visit (INDEPENDENT_AMBULATORY_CARE_PROVIDER_SITE_OTHER): Payer: 59 | Admitting: Obstetrics & Gynecology

## 2016-03-10 VITALS — BP 90/62 | HR 78 | Ht 66.0 in | Wt 103.8 lb

## 2016-03-10 DIAGNOSIS — N951 Menopausal and female climacteric states: Secondary | ICD-10-CM | POA: Diagnosis not present

## 2016-03-10 DIAGNOSIS — Z90710 Acquired absence of both cervix and uterus: Secondary | ICD-10-CM | POA: Diagnosis not present

## 2016-03-10 MED ORDER — ESTRADIOL 0.1 MG/24HR TD PTTW: 1.0000 | MEDICATED_PATCH | TRANSDERMAL | 12 refills | Status: DC

## 2016-03-10 NOTE — Progress Notes (Signed)
Chief Complaint  Patient presents with  . discuss starting estrogen    Blood pressure 90/62, pulse 78, height 5\' 6"  (1.676 m), weight 103 lb 12.8 oz (47.1 kg), last menstrual period 11/26/2014.  40 y.o. G0P0000 Patient's last menstrual period was 11/26/2014. The current method of family planning is status post hysterectomy.  Outpatient Encounter Prescriptions as of 03/10/2016  Medication Sig  . acetaminophen (TYLENOL) 500 MG tablet Take 1,000 mg by mouth every 4 (four) hours as needed for mild pain, moderate pain or headache. Reported on 05/13/2015  . ALPRAZolam (XANAX) 0.5 MG tablet Take 1 tablet (0.5 mg total) by mouth at bedtime. Take 1 tab QHS and additional 1/2 tab daily as needed  . escitalopram (LEXAPRO) 10 MG tablet Take 1 tablet (10 mg total) by mouth at bedtime.  . promethazine (PHENERGAN) 25 MG tablet Take 1 tablet (25 mg total) by mouth 2 (two) times daily as needed for nausea or vomiting.  . triamcinolone ointment (KENALOG) 0.5 % Apply 1 application topically 2 (two) times daily. (Patient taking differently: Apply 1 application topically as needed. )  . Eluxadoline (VIBERZI) 100 MG TABS Take 100 mg by mouth 2 (two) times daily. (Patient not taking: Reported on 03/10/2016)  . [START ON 03/13/2016] estradiol (VIVELLE-DOT) 0.1 MG/24HR patch Place 1 patch (0.1 mg total) onto the skin 2 (two) times a week.   No facility-administered encounter medications on file as of 03/10/2016.     Subjective Pt here to begin ERT s/p TAHBSO for endometriosis Having some hot flashes and night sweats  Objective   Pertinent ROS   Labs or studies     Impression Diagnoses this Encounter::   ICD-9-CM ICD-10-CM   1. Menopausal symptoms 627.2 N95.1     Established relevant diagnosis(es):   Plan/Recommendations: Meds ordered this encounter  Medications  . estradiol (VIVELLE-DOT) 0.1 MG/24HR patch    Sig: Place 1 patch (0.1 mg total) onto the skin 2 (two) times a week.   Dispense:  8 patch    Refill:  12    Labs or Scans Ordered: No orders of the defined types were placed in this encounter.   Management::   Follow up Return in about 6 months (around 09/07/2016) for yearly, with Dr Elonda Husky.        Face to face time:  15 minutes  Greater than 50% of the visit time was spent in counseling and coordination of care with the patient.  The summary and outline of the counseling and care coordination is summarized in the note above.   All questions were answered.  Past Medical History:  Diagnosis Date  . Abdominal spasms   . Anemia    HX  . Anxiety    panic attacks  . Cancer (HCC)    Skin  . Chronic diarrhea    Hx - IBS diet controlled  . Chronic fatigue   . Depression    no meds  . Endometriosis   . External hemorrhoids   . IBS (irritable bowel syndrome) 01/11/2016  . Nausea    History    Past Surgical History:  Procedure Laterality Date  . ABDOMINAL HYSTERECTOMY N/A 07/21/2015   Procedure: HYSTERECTOMY ABDOMINAL;  Surgeon: Florian Buff, MD;  Location: [facility];  Service: Gynecology;  Laterality: N/A;  . BIOPSY N/A 01/27/2016   Procedure: BIOPSY;  Surgeon: Rogene Houston, MD;  Location: [facility];  Service: Endoscopy;  Laterality: N/A;  . COLONOSCOPY  08/25/2010  . diagnosis    .  ESOPHAGOGASTRODUODENOSCOPY N/A 01/27/2016   Procedure: ESOPHAGOGASTRODUODENOSCOPY (EGD);  Surgeon: Rogene Houston, MD;  Location: [facility];  Service: Endoscopy;  Laterality: N/A;  9:30  . LAPAROSCOPY N/A 06/23/2013   Procedure: LAPAROSCOPY OPERATIVE AND FULGRATION OF ENDOMETRIOSIS;  Surgeon: Marylynn Pearson, MD;  Location: [place] [facility];  Service: Gynecology;  Laterality: N/A;  cervical laceration repair   . SALPINGOOPHORECTOMY Bilateral 07/21/2015   Procedure: SALPINGO OOPHORECTOMY;  Surgeon: Florian Buff, MD;  Location: [facility];  Service: Gynecology;  Laterality: Bilateral;  . WISDOM TOOTH EXTRACTION      OB History    Gravida Para Term Preterm AB Living   0  0 0 0 0 0   SAB TAB Ectopic Multiple Live Births   0 0 0 0        Allergies  Allergen Reactions  . Bentyl [Dicyclomine Hcl] Nausea Only  . Hydrocodone Nausea And Vomiting    Social History   Social History  . Marital status: Married    Spouse name: N/A  . Number of children: N/A  . Years of education: N/A   Social History Main Topics  . Smoking status: Never Smoker  . Smokeless tobacco: Never Used  . Alcohol use No  . Drug use: No  . Sexual activity: Yes    Birth control/ protection: Surgical   Other Topics Concern  . Not on file   Social History Narrative  . No narrative on file    Family History  Problem Relation Age of Onset  . Diabetes Father   . Cancer Father     lung,skin

## 2016-03-16 ENCOUNTER — Telehealth: Payer: Self-pay | Admitting: *Deleted

## 2016-03-16 ENCOUNTER — Other Ambulatory Visit: Payer: Self-pay | Admitting: Obstetrics & Gynecology

## 2016-03-16 MED ORDER — ESTRADIOL 2 MG PO TABS: 2.0000 mg | ORAL_TABLET | Freq: Every day | ORAL | 11 refills | Status: DC

## 2016-03-16 NOTE — Telephone Encounter (Signed)
Patient notified of new Prescription for Estrace sent to [facility] and to stop using the patch. Pt verbalized understanding.

## 2016-04-14 ENCOUNTER — Ambulatory Visit: Payer: 59 | Admitting: Family

## 2016-04-19 ENCOUNTER — Encounter: Payer: Self-pay | Admitting: Family

## 2016-04-19 ENCOUNTER — Ambulatory Visit (INDEPENDENT_AMBULATORY_CARE_PROVIDER_SITE_OTHER): Payer: 59 | Admitting: Family

## 2016-04-19 VITALS — BP 99/65 | HR 71 | Temp 97.6°F | Ht 66.0 in | Wt 110.6 lb

## 2016-04-19 DIAGNOSIS — F411 Generalized anxiety disorder: Secondary | ICD-10-CM

## 2016-04-19 DIAGNOSIS — K589 Irritable bowel syndrome without diarrhea: Secondary | ICD-10-CM

## 2016-04-19 MED ORDER — VORTIOXETINE HBR 5 MG PO TABS
5.0000 mg | ORAL_TABLET | Freq: Every day | ORAL | 3 refills | Status: DC
Start: 2016-04-19 — End: 2016-06-07

## 2016-04-19 MED ORDER — ALPRAZOLAM 0.5 MG PO TABS: 0.5000 mg | ORAL_TABLET | Freq: Every day | ORAL | 4 refills | Status: DC

## 2016-04-19 NOTE — Progress Notes (Signed)
Subjective:    Patient ID: Kristen Mullins, female    DOB: [birth year], 40 y.o.   MRN: [id number]  Pt presents to the office today for chronic follow up pt states she was unable to tolerate the lexapro.  Anxiety  Presents for follow-up visit. Symptoms include depressed mood, excessive worry, insomnia, irritability, nervous/anxious behavior, palpitations and panic. Patient reports no chest pain, muscle tension, restlessness or shortness of breath. Symptoms occur occasionally. The severity of symptoms is moderate. The quality of sleep is fair.    IBS Pt states this is stable at this time. She currently is not taking medications at this time and "just deals" with the diarrhea at times. Pt states the                                                                                                               made her "worse".     Review of Systems  Constitutional: Positive for irritability.  HENT: Negative.   Eyes: Negative.   Respiratory: Negative.  Negative for shortness of breath.   Cardiovascular: Positive for palpitations. Negative for chest pain.  Gastrointestinal: Negative.   Endocrine: Negative.   Genitourinary: Negative.   Musculoskeletal: Negative.   Neurological: Negative.  Negative for headaches.  Hematological: Negative.   Psychiatric/Behavioral: The patient is nervous/anxious and has insomnia.   All other systems reviewed and are negative.      Objective:   Physical Exam  Constitutional: She is oriented to person, place, and time. She appears well-developed and well-nourished. No distress.  HENT:  Head: Normocephalic and atraumatic.  Right Ear: External ear normal.  Left Ear: External ear normal.  Nose: Nose normal.  Mouth/Throat: Oropharynx is clear and moist.  Eyes: Pupils are equal, round, and reactive to light.  Neck: Normal range of motion. Neck supple. No thyromegaly present.  Cardiovascular: Normal rate, regular rhythm, normal heart sounds and intact distal  pulses.   No murmur heard. Pulmonary/Chest: Effort normal and breath sounds normal. No respiratory distress. She has no wheezes.  Abdominal: Soft. Bowel sounds are normal. She exhibits no distension. There is no tenderness.  Musculoskeletal: Normal range of motion. She exhibits no edema or tenderness.  Neurological: She is alert and oriented to person, place, and time.  Skin: Skin is warm and dry.  Psychiatric: She has a normal mood and affect. Her behavior is normal. Judgment and thought content normal.  Vitals reviewed.   BP 99/65   Pulse 71   Temp 97.6 F (36.4 C) (Oral)   Ht 5\' 6"  (1.676 m)   Wt 110 lb 9.6 oz (50.2 kg)   LMP 11/26/2014   BMI 17.85 kg/m        Assessment & Plan:  1. Irritable bowel syndrome, unspecified type -Increase fiber -Diet discussed  2. Generalized anxiety disorder -Pt started on Trintellix 5 mg today -Samples given to pt to try -Stress management discussed - ALPRAZolam (XANAX) 0.5 MG tablet; Take 1 tablet (0.5 mg total) by mouth at bedtime. Take 1 tab QHS and  additional 1/2 tab daily as needed  Dispense: 45 tablet; Refill: 4 - vortioxetine HBr (TRINTELLIX) 5 MG TABS; Take 1 tablet (5 mg total) by mouth daily.  Dispense: 30 tablet; Refill: [place], FNP

## 2016-04-19 NOTE — Patient Instructions (Signed)
Stress and Stress Management Stress is a normal reaction to life events. It is what you feel when life demands more than you are used to or more than you can handle. Some stress can be useful. For example, the stress reaction can help you catch the last bus of the day, study for a test, or meet a deadline at work. But stress that occurs too often or for too long can cause problems. It can affect your emotional health and interfere with relationships and normal daily activities. Too much stress can weaken your immune system and increase your risk for physical illness. If you already have a medical problem, stress can make it worse. What are the causes? All sorts of life events may cause stress. An event that causes stress for one person may not be stressful for another person. Major life events commonly cause stress. These may be positive or negative. Examples include losing your job, moving into a new home, getting married, having a baby, or losing a loved one. Less obvious life events may also cause stress, especially if they occur day after day or in combination. Examples include working long hours, driving in traffic, caring for children, being in debt, or being in a difficult relationship. What are the signs or symptoms? Stress may cause emotional symptoms including, the following:  Anxiety. This is feeling worried, afraid, on edge, overwhelmed, or out of control.  Anger. This is feeling irritated or impatient.  Depression. This is feeling sad, down, helpless, or guilty.  Difficulty focusing, remembering, or making decisions. Stress may cause physical symptoms, including the following:  Aches and pains. These may affect your head, neck, back, stomach, or other areas of your body.  Tight muscles or clenched jaw.  Low energy or trouble sleeping. Stress may cause unhealthy behaviors, including the following:  Eating to feel better (overeating) or skipping meals.  Sleeping too little, too  much, or both.  Working too much or putting off tasks (procrastination).  Smoking, drinking alcohol, or using drugs to feel better. How is this diagnosed? Stress is diagnosed through an assessment by your health care provider. Your health care provider will ask questions about your symptoms and any stressful life events.Your health care provider will also ask about your medical history and may order blood tests or other tests. Certain medical conditions and medicine can cause physical symptoms similar to stress. Mental illness can cause emotional symptoms and unhealthy behaviors similar to stress. Your health care provider may refer you to a mental health professional for further evaluation. How is this treated? Stress management is the recommended treatment for stress.The goals of stress management are reducing stressful life events and coping with stress in healthy ways. Techniques for reducing stressful life events include the following:  Stress identification. Self-monitor for stress and identify what causes stress for you. These skills may help you to avoid some stressful events.  Time management. Set your priorities, keep a calendar of events, and learn to say "no." These tools can help you avoid making too many commitments. Techniques for coping with stress include the following:  Rethinking the problem. Try to think realistically about stressful events rather than ignoring them or overreacting. Try to find the positives in a stressful situation rather than focusing on the negatives.  Exercise. Physical exercise can release both physical and emotional tension. The key is to find a form of exercise you enjoy and do it regularly.  Relaxation techniques. These relax the body and mind. Examples include yoga,  meditation, tai chi, biofeedback, deep breathing, progressive muscle relaxation, listening to music, being out in nature, journaling, and other hobbies. Again, the key is to find one or  more that you enjoy and can do regularly.  Healthy lifestyle. Eat a balanced diet, get plenty of sleep, and do not smoke. Avoid using alcohol or drugs to relax.  Strong support network. Spend time with family, friends, or other people you enjoy being around.Express your feelings and talk things over with someone you trust. Counseling or talktherapy with a mental health professional may be helpful if you are having difficulty managing stress on your own. Medicine is typically not recommended for the treatment of stress.Talk to your health care provider if you think you need medicine for symptoms of stress. Follow these instructions at home:  Keep all follow-up visits as directed by your health care provider.  Take all medicines as directed by your health care provider. Contact a health care provider if:  Your symptoms get worse or you start having new symptoms.  You feel overwhelmed by your problems and can no longer manage them on your own. Get help right away if:  You feel like hurting yourself or someone else. This information is not intended to replace advice given to you by your health care provider. Make sure you discuss any questions you have with your health care provider. Document Released: 10/18/2000 Document Revised: 09/30/2015 Document Reviewed: 12/17/2012 Elsevier Interactive Patient Education  2017 [employer].

## 2016-05-31 ENCOUNTER — Other Ambulatory Visit: Payer: Self-pay | Admitting: *Deleted

## 2016-05-31 MED ORDER — ESTRADIOL 2 MG PO TABS: 2.0000 mg | ORAL_TABLET | Freq: Every day | ORAL | 3 refills | Status: DC

## 2016-06-01 ENCOUNTER — Telehealth: Payer: Self-pay | Admitting: *Deleted

## 2016-06-01 NOTE — Telephone Encounter (Signed)
Informed patient prescription was sent to pharmacy. Pt verbalized understanding.

## 2016-06-07 ENCOUNTER — Encounter (INDEPENDENT_AMBULATORY_CARE_PROVIDER_SITE_OTHER): Payer: Self-pay | Admitting: Internal Medicine

## 2016-06-07 ENCOUNTER — Ambulatory Visit (INDEPENDENT_AMBULATORY_CARE_PROVIDER_SITE_OTHER): Payer: 59 | Admitting: Internal Medicine

## 2016-06-07 VITALS — BP 100/76 | HR 64 | Temp 97.8°F | Ht 66.0 in | Wt 100.7 lb

## 2016-06-07 DIAGNOSIS — K588 Other irritable bowel syndrome: Secondary | ICD-10-CM | POA: Diagnosis not present

## 2016-06-07 DIAGNOSIS — F321 Major depressive disorder, single episode, moderate: Secondary | ICD-10-CM | POA: Diagnosis not present

## 2016-06-07 MED ORDER — CITALOPRAM HYDROBROMIDE 10 MG PO TABS: 10.0000 mg | ORAL_TABLET | Freq: Every day | ORAL | 1 refills | Status: DC

## 2016-06-07 MED ORDER — CITALOPRAM HYDROBROMIDE 20 MG PO TABS: 20.0000 mg | ORAL_TABLET | Freq: Every day | ORAL | 3 refills | Status: DC

## 2016-06-07 NOTE — Progress Notes (Signed)
Subjective:    Patient ID: Kristen Mullins, female    DOB: 21-Jun-1975, 41 y.o.   MRN: [id number] 01/11/2016 Wt 110  HPIHere today for f/u. She was last seen in September of this year for IBS. She has nausea and diarrhea. She underwent an EGD in September for anorexia, nausea, wt loss. Normal esophagus. Erythematous mucosa in the antrum. Normal duodenal bulb and first duodenum.  Biopsy: Benign small bowel mucosa.  No H. Pylori. Dr. Laural Golden started her on Lexapro but she felt she had a weight on her chest and she felt like she had the flu and it suppressed her appetite.  She has chronic fatigue. She does not have an appetite. If she forces herself to eat, she has nausea and diarrhea. She says she feels like she is depressed. Her father died from cancer. She usually has a BM daily, sometimes she will skip a day.   Her last colonoscopy was in April of 2012: Terminal ileum was normal. Colonic mucosa was also normal except she had a single cecal diverticulum.  External hemorrhoids. Biopsy: negative for microscopic and/or collagenous colitis.   01/13/2016 Emptying study: Normal.   08/08/2010 HIDA scan:  Clinical Data: Nausea and diarrhea  Impression : Normal.     12/31/2015 US abdomen complete:   Study Result   CLINICAL DATA: Severe nausea, diarrhea, symptoms felt to be related to food. Diffuse abdominal tenderness ; intermittent episodes of the symptoms since 20/12; history of irritable bowel syndrome.  Impression: Normal  02/10/2016 Cortisol normal. sedrate normal. TSH 1.41.  01/13/2016 NM Emptying study: normal.   Her last colonoscopy was in April of 2012: Terminal ileum was normal. Colonic mucosa was also normal except she had a single cecal diverticulum.  External hemorrhoids. Biopsy: negative for microscopic and/or collagenous colitis.   08/08/2010 HIDA scan:  Clinical Data: Nausea and diarrhea Impression : Normal.  12/31/2015 US abdomen complete:   Study Result   CLINICAL DATA:  Severe nausea, diarrhea, symptoms felt to be related to food. Diffuse abdominal tenderness ; intermittent episodes of the symptoms since 20/12; history of irritable bowel syndrome.  Impression: Normal   Review of Systems Past Medical History:  Diagnosis Date  . Abdominal spasms   . Anemia    HX  . Anxiety    panic attacks  . Cancer (HCC)    Skin  . Chronic diarrhea    Hx - IBS diet controlled  . Chronic fatigue   . Depression    no meds  . Endometriosis   . External hemorrhoids   . IBS (irritable bowel syndrome) 01/11/2016  . Nausea    History    Past Surgical History:  Procedure Laterality Date  . ABDOMINAL HYSTERECTOMY N/A 07/21/2015   Procedure: HYSTERECTOMY ABDOMINAL;  Surgeon: Florian Buff, MD;  Location: [facility];  Service: Gynecology;  Laterality: N/A;  . BIOPSY N/A 01/27/2016   Procedure: BIOPSY;  Surgeon: Rogene Houston, MD;  Location: [facility];  Service: Endoscopy;  Laterality: N/A;  . COLONOSCOPY  08/25/2010  . diagnosis    . ESOPHAGOGASTRODUODENOSCOPY N/A 01/27/2016   Procedure: ESOPHAGOGASTRODUODENOSCOPY (EGD);  Surgeon: Rogene Houston, MD;  Location: [facility];  Service: Endoscopy;  Laterality: N/A;  9:30  . LAPAROSCOPY N/A 06/23/2013   Procedure: LAPAROSCOPY OPERATIVE AND FULGRATION OF ENDOMETRIOSIS;  Surgeon: Marylynn Pearson, MD;  Location: [facility];  Service: Gynecology;  Laterality: N/A;  cervical laceration repair   . SALPINGOOPHORECTOMY Bilateral 07/21/2015   Procedure: SALPINGO OOPHORECTOMY;  Surgeon:  Florian Buff, MD;  Location: [facility];  Service: Gynecology;  Laterality: Bilateral;  . WISDOM TOOTH EXTRACTION      Allergies  Allergen Reactions  . Bentyl [Dicyclomine Hcl] Nausea Only  . Hydrocodone Nausea And Vomiting    Current Outpatient Prescriptions on File Prior to Visit  Medication Sig Dispense Refill  . acetaminophen (TYLENOL) 500 MG tablet Take 1,000 mg by mouth every 4 (four) hours as needed for mild pain, moderate pain or headache.  Reported on 05/13/2015    . ALPRAZolam (XANAX) 0.5 MG tablet Take 1 tablet (0.5 mg total) by mouth at bedtime. Take 1 tab QHS and additional 1/2 tab daily as needed 45 tablet 4  . estradiol (ESTRACE) 2 MG tablet Take 1 tablet (2 mg total) by mouth daily. 90 tablet 3  . promethazine (PHENERGAN) 25 MG tablet Take 1 tablet (25 mg total) by mouth 2 (two) times daily as needed for nausea or vomiting. 20 tablet 0  . triamcinolone ointment (KENALOG) 0.5 % Apply 1 application topically 2 (two) times daily. (Patient taking differently: Apply 1 application topically as needed. ) 60 g 3  . escitalopram (LEXAPRO) 10 MG tablet Take 1 tablet (10 mg total) by mouth at bedtime. (Patient not taking: Reported on 04/19/2016) 30 tablet 1   No current facility-administered medications on file prior to visit.        Objective:   Physical Exam Blood pressure 100/76, pulse 64, temperature 97.8 F (36.6 C), height 5\' 6"  (1.676 m), weight 100 lb 11.2 oz (45.7 kg), last menstrual period 04/24/2015.  Alert and oriented. Skin warm and dry. Oral mucosa is moist.   . Sclera anicteric, conjunctivae is pink. Thyroid not enlarged. No cervical lymphadenopathy. Lungs clear. Heart regular rate and rhythm.  Abdomen is soft. Bowel sounds are positive. No hepatomegaly. No abdominal masses felt. No tenderness.  No edema to lower extremities. Patient appears depressed. She states she is not suicidal.        Assessment & Plan:  Nausea, weight loss, suspect depression.  She has not sought any counseling for her depression.  Rx celexa 20mg  sent to her pharmacy.   She will have office visit in 3 months  She states she will follow up with Behavioral health.  I have placed a referral to [facility].  CBC and Hepatic function.

## 2016-06-07 NOTE — Patient Instructions (Signed)
Rx for Celexa 20mg ., Referral to [facility].

## 2016-06-08 ENCOUNTER — Emergency Department (HOSPITAL_COMMUNITY)
Admission: EM | Admit: 2016-06-08 | Discharge: 2016-06-08 | Disposition: A | Discharge status: Home / Self Care | Payer: 59 | Attending: Emergency Medicine | Admitting: Emergency Medicine

## 2016-06-08 ENCOUNTER — Emergency Department (HOSPITAL_COMMUNITY): Payer: 59

## 2016-06-08 ENCOUNTER — Encounter (HOSPITAL_COMMUNITY): Payer: Self-pay | Admitting: Emergency Medicine

## 2016-06-08 DIAGNOSIS — Z85820 Personal history of malignant melanoma of skin: Secondary | ICD-10-CM | POA: Insufficient documentation

## 2016-06-08 DIAGNOSIS — K6389 Other specified diseases of intestine: Secondary | ICD-10-CM | POA: Insufficient documentation

## 2016-06-08 DIAGNOSIS — Z79899 Other long term (current) drug therapy: Secondary | ICD-10-CM | POA: Insufficient documentation

## 2016-06-08 DIAGNOSIS — R197 Diarrhea, unspecified: Secondary | ICD-10-CM | POA: Insufficient documentation

## 2016-06-08 LAB — CBC
HEMATOCRIT: 40.7 % (ref 36.0–46.0)
HEMOGLOBIN: 14.1 g/dL (ref 12.0–15.0)
MCH: 29.9 pg (ref 26.0–34.0)
MCHC: 34.6 g/dL (ref 30.0–36.0)
MCV: 86.2 fL (ref 78.0–100.0)
Platelets: 188 10*3/uL (ref 150–400)
RBC: 4.72 MIL/uL (ref 3.87–5.11)
RDW: 12.2 % (ref 11.5–15.5)
WBC: 5.8 10*3/uL (ref 4.0–10.5)

## 2016-06-08 LAB — URINALYSIS, ROUTINE W REFLEX MICROSCOPIC
BACTERIA UA: NONE SEEN
Bilirubin Urine: NEGATIVE
GLUCOSE, UA: NEGATIVE mg/dL
KETONES UR: 20 mg/dL — AB
LEUKOCYTES UA: NEGATIVE
Nitrite: NEGATIVE
PROTEIN: NEGATIVE mg/dL
Specific Gravity, Urine: 1.012 (ref 1.005–1.030)
pH: 5 (ref 5.0–8.0)

## 2016-06-08 LAB — CBC WITH DIFFERENTIAL/PLATELET
BASOS PCT: 1 %
Basophils Absolute: 48 cells/uL (ref 0–200)
EOS ABS: 48 {cells}/uL (ref 15–500)
Eosinophils Relative: 1 %
HCT: 42.6 % (ref 35.0–45.0)
Hemoglobin: 14.4 g/dL (ref 11.7–15.5)
LYMPHS PCT: 23 %
Lymphs Abs: 1104 cells/uL (ref 850–3900)
MCH: 29.6 pg (ref 27.0–33.0)
MCHC: 33.8 g/dL (ref 32.0–36.0)
MCV: 87.5 fL (ref 80.0–100.0)
MONOS PCT: 6 %
MPV: 10.3 fL (ref 7.5–12.5)
Monocytes Absolute: 288 cells/uL (ref 200–950)
Neutro Abs: 3312 cells/uL (ref 1500–7800)
Neutrophils Relative %: 69 %
PLATELETS: 190 10*3/uL (ref 140–400)
RBC: 4.87 MIL/uL (ref 3.80–5.10)
RDW: 13.4 % (ref 11.0–15.0)
WBC: 4.8 10*3/uL (ref 3.8–10.8)

## 2016-06-08 LAB — COMPREHENSIVE METABOLIC PANEL
ALT: 10 U/L — ABNORMAL LOW (ref 14–54)
AST: 16 U/L (ref 15–41)
Albumin: 4.4 g/dL (ref 3.5–5.0)
Alkaline Phosphatase: 73 U/L (ref 38–126)
Anion gap: 9 (ref 5–15)
BUN: 13 mg/dL (ref 6–20)
CO2: 25 mmol/L (ref 22–32)
Calcium: 9.6 mg/dL (ref 8.9–10.3)
Chloride: 105 mmol/L (ref 101–111)
Creatinine, Ser: 0.89 mg/dL (ref 0.44–1.00)
Glucose, Bld: 93 mg/dL (ref 65–99)
POTASSIUM: 3.7 mmol/L (ref 3.5–5.1)
SODIUM: 139 mmol/L (ref 135–145)
Total Bilirubin: 1.1 mg/dL (ref 0.3–1.2)
Total Protein: 7.5 g/dL (ref 6.5–8.1)

## 2016-06-08 LAB — HEPATIC FUNCTION PANEL
ALT: 7 U/L (ref 6–29)
AST: 14 U/L (ref 10–30)
Albumin: 4.4 g/dL (ref 3.6–5.1)
Alkaline Phosphatase: 71 U/L (ref 33–115)
BILIRUBIN INDIRECT: 0.7 mg/dL (ref 0.2–1.2)
Bilirubin, Direct: 0.1 mg/dL (ref <=0.2)
TOTAL PROTEIN: 7.3 g/dL (ref 6.1–8.1)
Total Bilirubin: 0.8 mg/dL (ref 0.2–1.2)

## 2016-06-08 LAB — LIPASE, BLOOD: LIPASE: 24 U/L (ref 11–51)

## 2016-06-08 MED ORDER — ONDANSETRON HCL 4 MG/2ML IJ SOLN
INTRAMUSCULAR | Status: AC
  Filled 2016-06-08: qty 2

## 2016-06-08 MED ORDER — ONDANSETRON HCL 4 MG/2ML IJ SOLN
4.0000 mg | Freq: Once | INTRAMUSCULAR | Status: AC
  Administered 2016-06-08: 4 mg via INTRAVENOUS

## 2016-06-08 MED ORDER — PROMETHAZINE HCL 25 MG/ML IJ SOLN
INTRAMUSCULAR | Status: AC
  Filled 2016-06-08: qty 1

## 2016-06-08 MED ORDER — PROMETHAZINE HCL 25 MG/ML IJ SOLN
12.5000 mg | Freq: Once | INTRAMUSCULAR | Status: AC
  Administered 2016-06-08: 12.5 mg via INTRAVENOUS
  Filled 2016-06-08: qty 1

## 2016-06-08 MED ORDER — SODIUM CHLORIDE 0.9 % IV BOLUS (SEPSIS)
1000.0000 mL | Freq: Once | INTRAVENOUS | Status: AC
  Administered 2016-06-08: 1000 mL via INTRAVENOUS

## 2016-06-08 MED ORDER — IOPAMIDOL (ISOVUE-300) INJECTION 61%
75.0000 mL | Freq: Once | INTRAVENOUS | Status: AC | PRN
  Administered 2016-06-08: 75 mL via INTRAVENOUS

## 2016-06-08 NOTE — Discharge Instructions (Signed)
Taking Phenergan for nausea. If you not eating well you need to drink some ensure. Get in touch with Dr Laural Golden if he does not get your colonoscopy scheduled

## 2016-06-08 NOTE — ED Notes (Signed)
Husband reports that pt has lost 30 lbs in last month.  Pt states that she has not been "right" since her hysterectomy in March.  Pt states that she sees Rehman for GI as well.

## 2016-06-08 NOTE — ED Provider Notes (Signed)
[facility] DEPT Provider Note   CSN: [id number] Arrival date & time: 06/08/16  1511     History   Chief Complaint Chief Complaint  Patient presents with  . Diarrhea    HPI Kristen Mullins is a 41 y.o. female.  Patient states that she has lost 30 pounds over the last few months.   She has persistent nausea and diarrhea. She was seen by the GI doctor yesterday and started on Celexa   The history is provided by the patient.  Diarrhea   This is a chronic problem. The current episode started more than 1 week ago. The problem occurs 2 to 4 times per day. The problem has not changed since onset.The stool consistency is described as watery. There has been no fever. Pertinent negatives include no abdominal pain, no chills, no headaches and no cough. She has tried nothing for the symptoms.    Past Medical History:  Diagnosis Date  . Abdominal spasms   . Anemia    HX  . Anxiety    panic attacks  . Cancer (HCC)    Skin  . Chronic diarrhea    Hx - IBS diet controlled  . Chronic fatigue   . Depression    no meds  . Endometriosis   . External hemorrhoids   . IBS (irritable bowel syndrome) 01/11/2016  . Nausea    History    Patient Active Problem List   Diagnosis Date Noted  . Chronic nausea 01/25/2016  . IBS (irritable bowel syndrome) 01/11/2016  . S/P total hysterectomy and bilateral salpingo-oophorectomy 07/21/2015  . Generalized anxiety disorder 12/08/2013    Past Surgical History:  Procedure Laterality Date  . ABDOMINAL HYSTERECTOMY N/A 07/21/2015   Procedure: HYSTERECTOMY ABDOMINAL;  Surgeon: Florian Buff, MD;  Location: [facility];  Service: Gynecology;  Laterality: N/A;  . BIOPSY N/A 01/27/2016   Procedure: BIOPSY;  Surgeon: Rogene Houston, MD;  [facility];  Service: Endoscopy;  Laterality: N/A;  . COLONOSCOPY  08/25/2010  . diagnosis    . ESOPHAGOGASTRODUODENOSCOPY N/A 01/27/2016   Procedure: ESOPHAGOGASTRODUODENOSCOPY (EGD);  Surgeon: Rogene Houston, MD;   Location: [facility];  Service: Endoscopy;  Laterality: N/A;  9:30  . LAPAROSCOPY N/A 06/23/2013   Procedure: LAPAROSCOPY OPERATIVE AND FULGRATION OF ENDOMETRIOSIS;  Surgeon: Marylynn Pearson, MD;  Location: [facility];  Service: Gynecology;  Laterality: N/A;  cervical laceration repair   . SALPINGOOPHORECTOMY Bilateral 07/21/2015   Procedure: SALPINGO OOPHORECTOMY;  Surgeon: Florian Buff, MD;  Location: [facility];  Service: Gynecology;  Laterality: Bilateral;  . WISDOM TOOTH EXTRACTION      OB History    Gravida Para Term Preterm AB Living   0 0 0 0 0 0   SAB TAB Ectopic Multiple Live Births   0 0 0 0         Home Medications    Prior to Admission medications   Medication Sig Start Date End Date Taking? Authorizing Provider  acetaminophen (TYLENOL) 500 MG tablet Take 1,000 mg by mouth every 4 (four) hours as needed for mild pain, moderate pain or headache. Reported on 05/13/2015   Yes Historical Provider, MD  ALPRAZolam Duanne Moron) 0.5 MG tablet Take 1 tablet (0.5 mg total) by mouth at bedtime. Take 1 tab QHS and additional 1/2 tab daily as needed 04/19/16  Yes Sharion Balloon, FNP  estradiol (ESTRACE) 2 MG tablet Take 1 tablet (2 mg total) by mouth daily. 05/31/16  Yes Florian Buff, MD  citalopram (CELEXA) 20 MG tablet Take 1 tablet by mouth daily. 06/07/16   Historical Provider, MD  promethazine (PHENERGAN) 25 MG tablet Take 1 tablet (25 mg total) by mouth 2 (two) times daily as needed for nausea or vomiting. 02/11/16   Rogene Houston, MD    Family History Family History  Problem Relation Age of Onset  . Diabetes Father   . Cancer Father     lung,skin    Social History Social History  Substance Use Topics  . Smoking status: Never Smoker  . Smokeless tobacco: Never Used  . Alcohol use No     Allergies   Bentyl [dicyclomine hcl] and Hydrocodone   Review of Systems Review of Systems  Constitutional: Negative for appetite change, chills and fatigue.  HENT: Negative for  congestion, ear discharge and sinus pressure.   Eyes: Negative for discharge.  Respiratory: Negative for cough.   Cardiovascular: Negative for chest pain.  Gastrointestinal: Positive for diarrhea. Negative for abdominal pain.  Genitourinary: Negative for frequency and hematuria.  Musculoskeletal: Negative for back pain.  Skin: Negative for rash.  Neurological: Negative for seizures and headaches.  Psychiatric/Behavioral: Negative for hallucinations.     Physical Exam Updated Vital Signs BP 110/78 (BP Location: Right Arm)   Pulse 78   Temp 98.2 F (36.8 C) (Oral)   Resp 16   Ht 5\' 6"  (1.676 m)   Wt 100 lb (45.4 kg)   LMP 04/24/2015   SpO2 100%   BMI 16.14 kg/m   Physical Exam  Constitutional: She is oriented to person, place, and time.  Patient cachectic  HENT:  Head: Normocephalic.  Eyes: Conjunctivae and EOM are normal. No scleral icterus.  Neck: Neck supple. No thyromegaly present.  Cardiovascular: Normal rate and regular rhythm.  Exam reveals no gallop and no friction rub.   No murmur heard. Pulmonary/Chest: No stridor. She has no wheezes. She has no rales. She exhibits no tenderness.  Abdominal: She exhibits no distension. There is no tenderness. There is no rebound.  Musculoskeletal: Normal range of motion. She exhibits no edema.  Lymphadenopathy:    She has no cervical adenopathy.  Neurological: She is oriented to person, place, and time. She exhibits normal muscle tone. Coordination normal.  Skin: No rash noted. No erythema.  Psychiatric: She has a normal mood and affect. Her behavior is normal.     ED Treatments / Results  Labs (all labs ordered are listed, but only abnormal results are displayed) Labs Reviewed  COMPREHENSIVE METABOLIC PANEL - Abnormal; Notable for the following:       Result Value   ALT 10 (*)    All other components within normal limits  URINALYSIS, ROUTINE W REFLEX MICROSCOPIC - Abnormal; Notable for the following:    Hgb urine  dipstick SMALL (*)    Ketones, ur 20 (*)    All other components within normal limits  LIPASE, BLOOD  CBC    EKG  EKG Interpretation None       Radiology Ct Abdomen Pelvis W Contrast  Result Date: 06/08/2016 CLINICAL DATA:  Nausea and weight loss. EXAM: CT ABDOMEN AND PELVIS WITH CONTRAST TECHNIQUE: Multidetector CT imaging of the abdomen and pelvis was performed using the standard protocol following bolus administration of intravenous contrast. CONTRAST:  61mL ISOVUE-300 IOPAMIDOL (ISOVUE-300) INJECTION 61% COMPARISON:  None. FINDINGS: Lower chest: No acute abnormality. Hepatobiliary: No focal liver abnormality is seen. No gallstones, gallbladder wall thickening, or biliary dilatation. Pancreas: Unremarkable. No pancreatic ductal dilatation or surrounding  inflammatory changes. Spleen: Normal in size without focal abnormality. Adrenals/Urinary Tract: Adrenal glands are unremarkable. Kidneys are normal, without renal calculi, focal lesion, or hydronephrosis. Bladder is unremarkable. Stomach/Bowel: Stomach is within normal limits. Appendix appears normal. No evidence of small bowel wall thickening, distention, or inflammatory changes. Short segment of circumferential mild mucosal thickening of the descending colon. Vascular/Lymphatic: No significant vascular findings are present. No enlarged abdominal or pelvic lymph nodes. Reproductive: Status post hysterectomy. No adnexal masses. Other: No abdominal wall hernia or abnormality. No abdominopelvic ascites. Musculoskeletal: No acute or significant osseous findings. IMPRESSION: No abnormalities within the solid abdominal organs. Short segment of mild circumferential mucosal thickening of the descending colon, favor infectious or inflammatory. Correlation with colonoscopy may be considered to exclude underlying malignancy. Electronically Signed   By: Fidela Salisbury M.D.   On: 06/08/2016 19:58    Procedures Procedures (including critical care  time)  Medications Ordered in ED Medications  promethazine (PHENERGAN) 25 MG/ML injection (not administered)  sodium chloride 0.9 % bolus 1,000 mL (1,000 mLs Intravenous New Bag/Given 06/08/16 1742)  ondansetron (ZOFRAN) injection 4 mg (4 mg Intravenous Given 06/08/16 1742)  promethazine (PHENERGAN) injection 12.5 mg (12.5 mg Intravenous Given 06/08/16 1828)  iopamidol (ISOVUE-300) 61 % injection 75 mL (75 mLs Intravenous Contrast Given 06/08/16 1940)     Initial Impression / Assessment and Plan / ED Course  I have reviewed the triage vital signs and the nursing notes.  Pertinent labs & imaging results that were available during my care of the patient were reviewed by me and considered in my medical decision making (see chart for details).     CT scan shows some mucosal thickening in her colon. I spoke with her GI doctor who will follow-up with her surgeon and get her scheduled for a colonoscopy  Final Clinical Impressions(s) / ED Diagnoses   Final diagnoses:  Diarrhea, unspecified type    New Prescriptions New Prescriptions   No medications on file     Milton Ferguson, MD 06/08/16 2046

## 2016-06-12 ENCOUNTER — Telehealth (INDEPENDENT_AMBULATORY_CARE_PROVIDER_SITE_OTHER): Payer: Self-pay | Admitting: Internal Medicine

## 2016-06-12 NOTE — Telephone Encounter (Signed)
Patient called today, she was seen by [name] last week and then went to the ER at [facility].  She stated that Dr. Laural Golden communicated with her doctor and now she needs to have a colonoscopy.  We were not aware of what the next step is for this patient.  Please advise.  [phone]

## 2016-06-12 NOTE — Telephone Encounter (Signed)
She tells me she felt worse after seeing me in office. She tells me she cannot eat. She is nauseated. She did have the Rx filled for Celexa but has not started. There has been no recent antibiotics.

## 2016-06-13 ENCOUNTER — Telehealth (INDEPENDENT_AMBULATORY_CARE_PROVIDER_SITE_OTHER): Payer: Self-pay | Admitting: *Deleted

## 2016-06-13 ENCOUNTER — Encounter (INDEPENDENT_AMBULATORY_CARE_PROVIDER_SITE_OTHER): Payer: Self-pay | Admitting: *Deleted

## 2016-06-13 ENCOUNTER — Other Ambulatory Visit (INDEPENDENT_AMBULATORY_CARE_PROVIDER_SITE_OTHER): Payer: Self-pay | Admitting: *Deleted

## 2016-06-13 DIAGNOSIS — R1084 Generalized abdominal pain: Secondary | ICD-10-CM

## 2016-06-13 DIAGNOSIS — R9389 Abnormal findings on diagnostic imaging of other specified body structures: Secondary | ICD-10-CM | POA: Insufficient documentation

## 2016-06-13 DIAGNOSIS — R197 Diarrhea, unspecified: Secondary | ICD-10-CM | POA: Insufficient documentation

## 2016-06-13 MED ORDER — PEG 3350-KCL-NA BICARB-NACL 420 G PO SOLR: 4000.0000 mL | Freq: Once | ORAL | 0 refills | Status: AC

## 2016-06-13 NOTE — Telephone Encounter (Signed)
I talked with patient. He was in emergency room and underwent abdominopelvic CT which revealed thickening to descending colon. Last colonoscopy was in 2012 and she had thickening to sigmoid colon but no abnormality was identified. He remains with abdominal pain diarrhea and anorexia. She has not started Celexa. Patient was given wide bursae prescription by Dr. Wendi Snipes her PCP but she decided not to take this medication. Proceed with diagnostic colonoscopy under monitored anesthesia care.

## 2016-06-13 NOTE — Telephone Encounter (Signed)
Dr.Rehman is going to talk with the patient.

## 2016-06-13 NOTE — Telephone Encounter (Signed)
Patient needs trilyte 

## 2016-06-13 NOTE — Telephone Encounter (Signed)
TCS sch'd 2/16/185, patient aware, instructions faxed to pharmacy

## 2016-06-14 ENCOUNTER — Telehealth (HOSPITAL_COMMUNITY): Payer: Self-pay | Admitting: *Deleted

## 2016-06-14 NOTE — Telephone Encounter (Signed)
left voice message regarding an appointment. 

## 2016-06-15 ENCOUNTER — Telehealth (INDEPENDENT_AMBULATORY_CARE_PROVIDER_SITE_OTHER): Payer: Self-pay | Admitting: Internal Medicine

## 2016-06-15 NOTE — Telephone Encounter (Signed)
I advised her to go to the ED

## 2016-06-15 NOTE — Telephone Encounter (Signed)
Patient called, stated that she's not feeling any better.  No appetite, not able to eat.  She did try what she was told to try, but it's not agreeing with her.  [phone]

## 2016-06-16 ENCOUNTER — Encounter (HOSPITAL_COMMUNITY)
Admission: RE | Admit: 2016-06-16 | Discharge: 2016-06-16 | Disposition: A | Discharge status: Home / Self Care | Payer: 59 | Source: Ambulatory Visit | Attending: Internal Medicine | Admitting: Internal Medicine

## 2016-06-17 ENCOUNTER — Emergency Department (HOSPITAL_COMMUNITY)
Admission: EM | Admit: 2016-06-17 | Discharge: 2016-06-18 | Disposition: A | Discharge status: Home / Self Care | Payer: 59 | Attending: Emergency Medicine | Admitting: Emergency Medicine

## 2016-06-17 ENCOUNTER — Encounter (HOSPITAL_COMMUNITY): Payer: Self-pay | Admitting: Emergency Medicine

## 2016-06-17 ENCOUNTER — Emergency Department (HOSPITAL_COMMUNITY): Payer: 59

## 2016-06-17 DIAGNOSIS — Z85820 Personal history of malignant melanoma of skin: Secondary | ICD-10-CM | POA: Insufficient documentation

## 2016-06-17 DIAGNOSIS — E860 Dehydration: Secondary | ICD-10-CM | POA: Diagnosis not present

## 2016-06-17 DIAGNOSIS — Z79899 Other long term (current) drug therapy: Secondary | ICD-10-CM | POA: Diagnosis not present

## 2016-06-17 DIAGNOSIS — R0789 Other chest pain: Secondary | ICD-10-CM

## 2016-06-17 DIAGNOSIS — R110 Nausea: Secondary | ICD-10-CM | POA: Diagnosis present

## 2016-06-17 LAB — BASIC METABOLIC PANEL
Anion gap: 11 (ref 5–15)
BUN: 8 mg/dL (ref 6–20)
CHLORIDE: 100 mmol/L — AB (ref 101–111)
CO2: 27 mmol/L (ref 22–32)
CREATININE: 0.82 mg/dL (ref 0.44–1.00)
Calcium: 9.8 mg/dL (ref 8.9–10.3)
GFR calc Af Amer: >60 mL/min (ref >60)
GFR calc non Af Amer: >60 mL/min (ref >60)
Glucose, Bld: 86 mg/dL (ref 65–99)
POTASSIUM: 3.8 mmol/L (ref 3.5–5.1)
SODIUM: 138 mmol/L (ref 135–145)

## 2016-06-17 LAB — TROPONIN I: Troponin I: <0.03 ng/mL (ref <0.03)

## 2016-06-17 LAB — URINALYSIS, ROUTINE W REFLEX MICROSCOPIC
Bilirubin Urine: NEGATIVE
Glucose, UA: NEGATIVE mg/dL
Hgb urine dipstick: NEGATIVE
Ketones, ur: 80 mg/dL — AB
LEUKOCYTES UA: NEGATIVE
NITRITE: NEGATIVE
PH: 5 (ref 5.0–8.0)
Protein, ur: NEGATIVE mg/dL
SPECIFIC GRAVITY, URINE: 1.019 (ref 1.005–1.030)

## 2016-06-17 LAB — CBC
HEMATOCRIT: 43 % (ref 36.0–46.0)
Hemoglobin: 15.1 g/dL — ABNORMAL HIGH (ref 12.0–15.0)
MCH: 29.9 pg (ref 26.0–34.0)
MCHC: 35.1 g/dL (ref 30.0–36.0)
MCV: 85.1 fL (ref 78.0–100.0)
Platelets: 208 10*3/uL (ref 150–400)
RBC: 5.05 MIL/uL (ref 3.87–5.11)
RDW: 12.5 % (ref 11.5–15.5)
WBC: 5.9 10*3/uL (ref 4.0–10.5)

## 2016-06-17 NOTE — ED Notes (Signed)
Out of bed to BR 

## 2016-06-17 NOTE — ED Notes (Signed)
Pt reports weight loss, is a pt of Dr Otelia Limes who is being referred to a psychiatrist. She was seen here on Thursday, and seen at [facility] yesterday and was "Nothing was done" per pt and spouse.  She reports problems with appetite as well as lethargy and inability to eat- She is cachetic and frail appearing. Her spouse is very attentive- She was seen this week by the NP in Dr Otelia Limes office

## 2016-06-17 NOTE — ED Triage Notes (Addendum)
Patient c/o left side chest pain that started yesterday. Patient reports nausea and dizziness. Per patient has not ate solid meal in 2 weeks. Patient reports losing 10 pounds in one month. Patient seen here in ER a week ago for nausea and abd pain. Per patient received zofran and phenergan while here and had a CT. Patient has appointment with GI specialist on Friday due to inflammation in intestine on CT. Per patient taking phenergan at home with no relief.

## 2016-06-18 MED ORDER — ONDANSETRON HCL 4 MG/2ML IJ SOLN: 4.0000 mg | Freq: Once | INTRAMUSCULAR | Status: DC

## 2016-06-18 MED ORDER — METOCLOPRAMIDE HCL 5 MG/ML IJ SOLN: 5.0000 mg | Freq: Once | INTRAMUSCULAR | Status: DC

## 2016-06-18 MED ORDER — METOCLOPRAMIDE HCL 10 MG PO TABS: 10.0000 mg | ORAL_TABLET | Freq: Three times a day (TID) | ORAL | 0 refills | Status: DC

## 2016-06-18 MED ORDER — SODIUM CHLORIDE 0.9 % IV BOLUS (SEPSIS)
1000.0000 mL | Freq: Once | INTRAVENOUS | Status: AC
  Administered 2016-06-18: 1000 mL via INTRAVENOUS

## 2016-06-18 MED ORDER — ONDANSETRON HCL 4 MG/2ML IJ SOLN
4.0000 mg | Freq: Once | INTRAMUSCULAR | Status: AC
  Administered 2016-06-18: 4 mg via INTRAVENOUS
  Filled 2016-06-18: qty 2

## 2016-06-18 NOTE — Discharge Instructions (Signed)
Try the reglan for your nausea. Follow up with Dr Laural Golden about your nausea. Chest pain that only lasts a few seconds is not an indication of a problem of your heart or lungs. Recheck if you get a constant pain lasting more than 20-30 minutes.

## 2016-06-18 NOTE — ED Provider Notes (Signed)
[facility] DEPT Provider Note   CSN: [id number] Arrival date & time: 06/17/16  1705    By signing my name below, I, Macon Large, attest that this documentation has been prepared under the direction and in the presence of Rolland Porter, MD. Electronically Signed: Macon Large, ED Scribe. 06/18/16. 1:28 AM  Time seen 23:31 PM.  History   Chief Complaint Chief Complaint  Patient presents with  . Chest Pain   The history is provided by the patient and the spouse. No language interpreter was used.   HPI Comments: Kristen Mullins is a 41 y.o. female with PMHx IBS, endometriosis,hysterectomy, chronic fatigue syndrom who presents to the Emergency Department complaining of moderate, persistent," mild" nausea onset a couple of days ago. Pt was last seen in ED here on Feb 1 for same symptoms. She was given nausea medication and IV fluids with minimal relief after she went home. Pt reports associated appetite loss, weight loss, and episodic diarrhea. She notes having ~2 episodes of diarrhea a day. She states her nausea is worsened by the smell of food and after eatingany meals. Per pt's spouse, he notes pt has lost more than 20lbs of weight over the past couple of months due to her chronic nausea. She is currently being followed by Dr. Laural Golden. She also repots associated intermittent, left-sided chest pain, with episodes lasting from 3-5 seconds. Pt notes having mild sweating and shivering during these episodes. She states her chest pain has subsided. She states having a f/u with her GI a week ago and was told to look into a psychiatrist. Pt notes she has a scheduled colonoscopy this upcoming week. She denies vomiting and fever. Per pt, she is under a lot of stress at home. Pt's father passed away two years ago and states all the responsibility fell on her with her father's possessions. She denies abdominal pain or decreased urinary output. She denies fever. Although asking the nurse multiple times for  nausea medicine prior to me seeing her she states her nausea is "mild" now.   PCP is Dr. Timmothy Euler.  GI Dr Laural Golden  Past Medical History:  Diagnosis Date  . Abdominal spasms   . Anemia    HX  . Anxiety    panic attacks  . Cancer (HCC)    Skin  . Chronic diarrhea    Hx - IBS diet controlled  . Chronic fatigue   . Depression    no meds  . Endometriosis   . External hemorrhoids   . IBS (irritable bowel syndrome) 01/11/2016  . Nausea    History    Patient Active Problem List   Diagnosis Date Noted  . Generalized abdominal pain 06/13/2016  . Abnormal CT scan 06/13/2016  . Diarrhea 06/13/2016  . Chronic nausea 01/25/2016  . IBS (irritable bowel syndrome) 01/11/2016  . S/P total hysterectomy and bilateral salpingo-oophorectomy 07/21/2015  . Generalized anxiety disorder 12/08/2013    Past Surgical History:  Procedure Laterality Date  . ABDOMINAL HYSTERECTOMY N/A 07/21/2015   Procedure: HYSTERECTOMY ABDOMINAL;  Surgeon: Florian Buff, MD;  Location: [facility];  Service: Gynecology;  Laterality: N/A;  . BIOPSY N/A 01/27/2016   Procedure: BIOPSY;  Surgeon: Rogene Houston, MD;  Location: [facility];  Service: Endoscopy;  Laterality: N/A;  . COLONOSCOPY  08/25/2010  . diagnosis    . ESOPHAGOGASTRODUODENOSCOPY N/A 01/27/2016   Procedure: ESOPHAGOGASTRODUODENOSCOPY (EGD);  Surgeon: Rogene Houston, MD;  Location: [facility];  Service: Endoscopy;  Laterality: N/A;  9:30  . LAPAROSCOPY N/A 06/23/2013   Procedure: LAPAROSCOPY OPERATIVE AND FULGRATION OF ENDOMETRIOSIS;  Surgeon: Marylynn Pearson, MD;  Location: [facility];  Service: Gynecology;  Laterality: N/A;  cervical laceration repair   . SALPINGOOPHORECTOMY Bilateral 07/21/2015   Procedure: SALPINGO OOPHORECTOMY;  Surgeon: Florian Buff, MD;  Location: [facility];  Service: Gynecology;  Laterality: Bilateral;  . WISDOM TOOTH EXTRACTION      OB History    Gravida Para Term Preterm AB Living   0 0 0 0 0 0   SAB TAB Ectopic  Multiple Live Births   0 0 0 0         Home Medications    Prior to Admission medications   Medication Sig Start Date End Date Taking? Authorizing Provider  acetaminophen (TYLENOL) 500 MG tablet Take 500 mg by mouth every 8 (eight) hours as needed for mild pain, moderate pain or headache. Reported on 05/13/2015   Yes Historical Provider, MD  ALPRAZolam Duanne Moron) 0.5 MG tablet Take 1 tablet (0.5 mg total) by mouth at bedtime. Take 1 tab QHS and additional 1/2 tab daily as needed Patient taking differently: Take 0.25-0.5 mg by mouth See admin instructions. Take 0.5mg s  at night, may take an additional 0.25mg s as needed for anxiety 04/19/16  Yes Sharion Balloon, FNP  estradiol (ESTRACE) 2 MG tablet Take 1 tablet (2 mg total) by mouth daily. Patient taking differently: Take 1 mg by mouth daily.  05/31/16  Yes Florian Buff, MD  promethazine (PHENERGAN) 25 MG tablet Take 1 tablet (25 mg total) by mouth 2 (two) times daily as needed for nausea or vomiting. 02/11/16  Yes Rogene Houston, MD  tetrahydrozoline-zinc (EYE DROPS ALLERGY RELIEF) 0.05-0.25 % ophthalmic solution 2 drops 3 (three) times daily as needed.   Yes Historical Provider, MD  Ketotifen Fumarate (ALLERGY EYE DROPS OP) Apply 1 drop to eye daily as needed (allergies).    Historical Provider, MD  metoCLOPramide (REGLAN) 10 MG tablet Take 1 tablet (10 mg total) by mouth 4 (four) times daily -  before meals and at bedtime. 06/18/16   Rolland Porter, MD    Family History Family History  Problem Relation Age of Onset  . Diabetes Father   . Cancer Father     lung,skin    Social History Social History  Substance Use Topics  . Smoking status: Never Smoker  . Smokeless tobacco: Never Used  . Alcohol use No  lives at home Lives with spouse   Allergies   Bentyl [dicyclomine hcl] and Hydrocodone   Review of Systems Review of Systems  Constitutional: Positive for appetite change and unexpected weight change. Negative for fever.    Respiratory: Negative for shortness of breath.   Cardiovascular: Positive for chest pain.  Gastrointestinal: Positive for diarrhea and nausea. Negative for vomiting.  All other systems reviewed and are negative.    Physical Exam Updated Vital Signs BP 115/97   Pulse 86   Temp 98.7 F (37.1 C) (Oral)   Resp 14   Ht 5\' 6"  (1.676 m)   Wt 100 lb (45.4 kg)   LMP 04/24/2015   SpO2 99%   BMI 16.14 kg/m   Physical Exam  Constitutional: She is oriented to person, place, and time. She has a sickly appearance.  Underweight female. Tongue was moist.  Pt had been eating saltines and drinking fluids when I entered the room.   HENT:  Head: Normocephalic and atraumatic.  Right Ear: External ear normal.  Left  Ear: External ear normal.  Nose: Nose normal. No mucosal edema or rhinorrhea.  Mouth/Throat: Oropharynx is clear and moist and mucous membranes are normal. No dental abscesses or uvula swelling.  Tongue is not dry  Eyes: Conjunctivae and EOM are normal. Pupils are equal, round, and reactive to light.  Neck: Normal range of motion and full passive range of motion without pain. Neck supple.  Cardiovascular: Normal rate, regular rhythm and normal heart sounds.  Exam reveals no gallop and no friction rub.   No murmur heard. Pulmonary/Chest: Effort normal and breath sounds normal. No respiratory distress. She has no wheezes. She has no rhonchi. She has no rales. She exhibits no tenderness and no crepitus.  Abdominal: Soft. Normal appearance and bowel sounds are normal. She exhibits no distension. There is no tenderness. There is no rebound and no guarding.  Musculoskeletal: Normal range of motion. She exhibits no edema or tenderness.  Moves all extremities well.   Neurological: She is alert and oriented to person, place, and time. She has normal strength. No cranial nerve deficit.  Skin: Skin is warm, dry and intact. No rash noted. No erythema. No pallor.  Psychiatric: She has a normal  mood and affect. Her speech is normal and behavior is normal. Her mood appears not anxious.  Nursing note and vitals reviewed.    ED Treatments / Results   DIAGNOSTIC STUDIES: Oxygen Saturation is 99% on RA, normal by my interpretation.       Labs (all labs ordered are listed, but only abnormal results are displayed) Results for orders placed or performed during the hospital encounter of [id number]  Basic metabolic panel  Result Value Ref Range   Sodium 138 135 - 145 mmol/L   Potassium 3.8 3.5 - 5.1 mmol/L   Chloride 100 (L) 101 - 111 mmol/L   CO2 27 22 - 32 mmol/L   Glucose, Bld 86 65 - 99 mg/dL   BUN 8 6 - 20 mg/dL   Creatinine, Ser 0.82 0.44 - 1.00 mg/dL   Calcium 9.8 8.9 - 10.3 mg/dL   GFR calc non Af Amer >60 >60 mL/min   GFR calc Af Amer >60 >60 mL/min   Anion gap 11 5 - 15  CBC  Result Value Ref Range   WBC 5.9 4.0 - 10.5 K/uL   RBC 5.05 3.87 - 5.11 MIL/uL   Hemoglobin 15.1 (H) 12.0 - 15.0 g/dL   HCT 43.0 36.0 - 46.0 %   MCV 85.1 78.0 - 100.0 fL   MCH 29.9 26.0 - 34.0 pg   MCHC 35.1 30.0 - 36.0 g/dL   RDW 12.5 11.5 - 15.5 %   Platelets 208 150 - 400 K/uL  Troponin I  Result Value Ref Range   Troponin I <0.03 <0.03 ng/mL  Urinalysis, Routine w reflex microscopic  Result Value Ref Range   Color, Urine YELLOW YELLOW   APPearance CLEAR CLEAR   Specific Gravity, Urine 1.019 1.005 - 1.030   pH 5.0 5.0 - 8.0   Glucose, UA NEGATIVE NEGATIVE mg/dL   Hgb urine dipstick NEGATIVE NEGATIVE   Bilirubin Urine NEGATIVE NEGATIVE   Ketones, ur 80 (A) NEGATIVE mg/dL   Protein, ur NEGATIVE NEGATIVE mg/dL   Nitrite NEGATIVE NEGATIVE   Leukocytes, UA NEGATIVE NEGATIVE   Laboratory interpretation all normal except Some ketones in the urine consistent with dehydration, elevation of her hemoglobin consistent with dehydration   EKG  EKG Interpretation  Date/Time:  Saturday June 17 2016 17:25:07 EST Ventricular Rate:  102 PR Interval:  124 QRS Duration: 80 QT  Interval:  352 QTC Calculation: 458 R Axis:   96 Text Interpretation:  Sinus tachycardia Right atrial enlargement Rightward axis Nonspecific ST abnormality No significant change since last tracing 26 Dec 2015 Confirmed by Kailynn Satterly  MD-I, Chanoch Mccleery ([id number]) on 06/18/2016 12:43:45 AM       Radiology Dg Chest 2 View  Result Date: 06/17/2016 CLINICAL DATA:  Left-sided chest pain for 2 days EXAM: CHEST  2 VIEW COMPARISON:  None. FINDINGS: The heart size and mediastinal contours are within normal limits. Both lungs are clear. The visualized skeletal structures are unremarkable. IMPRESSION: No active cardiopulmonary disease. Electronically Signed   By: Inez Catalina M.D.   On: 06/17/2016 17:58    Ct Abdomen Pelvis W Contrast  Result Date: 06/08/2016 CLINICAL DATA:  Nausea and weight loss.. IMPRESSION: No abnormalities within the solid abdominal organs. Short segment of mild circumferential mucosal thickening of the descending colon, favor infectious or inflammatory. Correlation with colonoscopy may be considered to exclude underlying malignancy. Electronically Signed   By: Fidela Salisbury M.D.   On: 06/08/2016 19:58    Procedures Procedures (including critical care time)  Medications Ordered in ED Medications  sodium chloride 0.9 % bolus 1,000 mL (1,000 mLs Intravenous New Bag/Given 06/18/16 0056)  sodium chloride 0.9 % bolus 1,000 mL (1,000 mLs Intravenous New Bag/Given 06/18/16 0055)  ondansetron (ZOFRAN) injection 4 mg (4 mg Intravenous Given 06/18/16 0056)     Initial Impression / Assessment and Plan / ED Course  I have reviewed the triage vital signs and the nursing notes.  Pertinent labs & imaging results that were available during my care of the patient were reviewed by me and considered in my medical decision making (see chart for details).  COORDINATION OF CARE: 12:36 AM Discussed treatment plan with pt at bedside which includes labs, chest imaging, and EKG and and pt agreed to plan.  Patient was given IV fluids and IV Zofran. We talked about trying Reglan however she does not want to try it here she wants to try at home.  After getting her IV fluids and her IV Zofran patient's feeling better and feels ready to be discharged home. She has a colonoscopy scheduled this coming week.  Final Clinical Impressions(s) / ED Diagnoses   Final diagnoses:  Chronic nausea  Dehydration  Atypical chest pain    New Prescriptions New Prescriptions   METOCLOPRAMIDE (REGLAN) 10 MG TABLET    Take 1 tablet (10 mg total) by mouth 4 (four) times daily -  before meals and at bedtime.    Plan discharge  Rolland Porter, MD, FACEP  I personally performed the services described in this documentation, which was scribed in my presence. The recorded information has been reviewed and considered.  Rolland Porter, MD, Barbette Or, MD 06/18/16 [phone]

## 2016-06-18 NOTE — ED Notes (Signed)
Pt states understanding of care given and follow up instructions.  Pt able to ambulate to and from bathroom with steady gait.  Pt taken to vehicle in wheelchair to be transported home

## 2016-06-19 ENCOUNTER — Inpatient Hospital Stay (HOSPITAL_COMMUNITY): Admission: RE | Admit: 2016-06-19 | Payer: 59 | Source: Ambulatory Visit

## 2016-06-23 ENCOUNTER — Ambulatory Visit (HOSPITAL_COMMUNITY): Payer: 59 | Admitting: Anesthesiology

## 2016-06-23 ENCOUNTER — Encounter (HOSPITAL_COMMUNITY)
Admission: RE | Disposition: A | Discharge status: Home / Self Care | Payer: Self-pay | Source: Ambulatory Visit | Attending: Internal Medicine

## 2016-06-23 ENCOUNTER — Ambulatory Visit (HOSPITAL_COMMUNITY)
Admission: RE | Admit: 2016-06-23 | Discharge: 2016-06-23 | Disposition: A | Discharge status: Home / Self Care | Payer: 59 | Source: Ambulatory Visit | Attending: Internal Medicine | Admitting: Internal Medicine

## 2016-06-23 ENCOUNTER — Encounter (HOSPITAL_COMMUNITY): Payer: Self-pay

## 2016-06-23 DIAGNOSIS — K644 Residual hemorrhoidal skin tags: Secondary | ICD-10-CM | POA: Insufficient documentation

## 2016-06-23 DIAGNOSIS — F419 Anxiety disorder, unspecified: Secondary | ICD-10-CM | POA: Insufficient documentation

## 2016-06-23 DIAGNOSIS — Z79890 Hormone replacement therapy (postmenopausal): Secondary | ICD-10-CM | POA: Diagnosis not present

## 2016-06-23 DIAGNOSIS — K6389 Other specified diseases of intestine: Secondary | ICD-10-CM | POA: Diagnosis not present

## 2016-06-23 DIAGNOSIS — R933 Abnormal findings on diagnostic imaging of other parts of digestive tract: Secondary | ICD-10-CM | POA: Diagnosis not present

## 2016-06-23 DIAGNOSIS — R197 Diarrhea, unspecified: Secondary | ICD-10-CM

## 2016-06-23 DIAGNOSIS — K529 Noninfective gastroenteritis and colitis, unspecified: Secondary | ICD-10-CM | POA: Insufficient documentation

## 2016-06-23 DIAGNOSIS — Z79899 Other long term (current) drug therapy: Secondary | ICD-10-CM | POA: Insufficient documentation

## 2016-06-23 DIAGNOSIS — R1084 Generalized abdominal pain: Secondary | ICD-10-CM | POA: Insufficient documentation

## 2016-06-23 DIAGNOSIS — R9389 Abnormal findings on diagnostic imaging of other specified body structures: Secondary | ICD-10-CM | POA: Insufficient documentation

## 2016-06-23 HISTORY — PX: COLONOSCOPY WITH PROPOFOL: SHX5780

## 2016-06-23 HISTORY — PX: BIOPSY: SHX5522

## 2016-06-23 SURGERY — COLONOSCOPY WITH PROPOFOL
Anesthesia: Monitor Anesthesia Care

## 2016-06-23 MED ORDER — FENTANYL CITRATE (PF) 100 MCG/2ML IJ SOLN
INTRAMUSCULAR | Status: AC
  Filled 2016-06-23: qty 2

## 2016-06-23 MED ORDER — ONDANSETRON HCL 4 MG/2ML IJ SOLN: 4.0000 mg | Freq: Once | INTRAMUSCULAR | Status: DC

## 2016-06-23 MED ORDER — MIDAZOLAM HCL 2 MG/2ML IJ SOLN
INTRAMUSCULAR | Status: AC
  Filled 2016-06-23: qty 2

## 2016-06-23 MED ORDER — FENTANYL CITRATE (PF) 100 MCG/2ML IJ SOLN
25.0000 ug | Freq: Once | INTRAMUSCULAR | Status: AC
  Administered 2016-06-23: 25 ug via INTRAVENOUS

## 2016-06-23 MED ORDER — LIDOCAINE HCL (PF) 1 % IJ SOLN
INTRAMUSCULAR | Status: AC
  Filled 2016-06-23: qty 5

## 2016-06-23 MED ORDER — PROPOFOL 10 MG/ML IV BOLUS
INTRAVENOUS | Status: DC | PRN
  Administered 2016-06-23 (×2): 20 mg via INTRAVENOUS

## 2016-06-23 MED ORDER — CITALOPRAM HYDROBROMIDE 10 MG PO TABS: 10.0000 mg | ORAL_TABLET | Freq: Every day | ORAL | 2 refills | Status: DC

## 2016-06-23 MED ORDER — CHLORHEXIDINE GLUCONATE CLOTH 2 % EX PADS: 6.0000 | MEDICATED_PAD | Freq: Once | CUTANEOUS | Status: DC

## 2016-06-23 MED ORDER — MIDAZOLAM HCL 2 MG/2ML IJ SOLN
1.0000 mg | INTRAMUSCULAR | Status: AC
  Administered 2016-06-23 (×2): 1 mg via INTRAVENOUS

## 2016-06-23 MED ORDER — PROPOFOL 500 MG/50ML IV EMUL
INTRAVENOUS | Status: DC | PRN
  Administered 2016-06-23: 100 ug/kg/min via INTRAVENOUS

## 2016-06-23 MED ORDER — ONDANSETRON HCL 4 MG/2ML IJ SOLN
INTRAMUSCULAR | Status: AC
  Filled 2016-06-23: qty 2

## 2016-06-23 MED ORDER — LIDOCAINE HCL (CARDIAC) 10 MG/ML IV SOLN
INTRAVENOUS | Status: DC | PRN
  Administered 2016-06-23: 50 mg via INTRAVENOUS

## 2016-06-23 MED ORDER — LACTATED RINGERS IV SOLN
INTRAVENOUS | Status: DC
  Administered 2016-06-23: 08:00:00 via INTRAVENOUS

## 2016-06-23 NOTE — Op Note (Signed)
[facility] Patient Name: Kristen Mullins Procedure Date: 06/23/2016 7:47 AM MRN: [id number] Date of Birth: 1976/02/27 Attending MD: Hildred Laser , MD CSN: [id number] Age: 41 Admit Type: Outpatient Procedure:                Colonoscopy Indications:              Abnormal CT of the GI tract, Chronic diarrhea Providers:                Hildred Laser, MD, Charlyne Petrin RN, RN, Aram Candela Referring MD:              Medicines:                Propofol per Anesthesia Complications:            No immediate complications. Estimated Blood Loss:     Estimated blood loss was minimal. Procedure:                Pre-Anesthesia Assessment:                           - Prior to the procedure, a History and Physical                            was performed, and patient medications and                            allergies were reviewed. The patient's tolerance of                            previous anesthesia was also reviewed. The risks                            and benefits of the procedure and the sedation                            options and risks were discussed with the patient.                            All questions were answered, and informed consent                            was obtained. Prior Anticoagulants: The patient has                            taken no previous anticoagulant or antiplatelet                            agents. ASA Grade Assessment: II - A patient with                            mild systemic disease. After reviewing the risks  and benefits, the patient was deemed in                            satisfactory condition to undergo the procedure.                           After obtaining informed consent, the colonoscope                            was passed under direct vision. Throughout the                            procedure, the patient's blood pressure, pulse, and                            oxygen saturations  were monitored continuously. The                            [id number]) was introduced through the and                            advanced to the the terminal ileum, with                            identification of the appendiceal orifice and IC                            valve. The colonoscopy was performed without                            difficulty. The patient tolerated the procedure                            well. The quality of the bowel preparation was                            adequate to identify polyps 6 mm and larger in                            size. The terminal ileum, ileocecal valve,                            appendiceal orifice, and rectum were photographed. Scope In: 8:29:44 AM Scope Out: 8:50:42 AM Scope Withdrawal Time: 0 hours 13 minutes 28 seconds  Total Procedure Duration: 0 hours 20 minutes 58 seconds  Findings:      The perianal and digital rectal examinations were normal.      The ileum appeared normal.      The colon (entire examined portion) appeared normal. Biopsies were taken       with a cold forceps for histology.      The retroflexed view of the distal rectum and anal verge was normal and       showed no anal or rectal abnormalities.      External hemorrhoids were found during retroflexion. The hemorrhoids  were small. Impression:               - The terminal ileum is normal.                           - The entire examined colon is normal. Biopsied.                           - Small external externahoids. Moderate Sedation:      Per Anesthesia Care Recommendation:           - Patient has a contact number available for                            emergencies. The signs and symptoms of potential                            delayed complications were discussed with the                            patient. Return to normal activities tomorrow.                            Written discharge instructions were provided to the                             patient.                           - Resume previous diet today.                           - Continue present medications.                           - Await pathology results.                           - Celexa 10 mg by mouth daily.                           - Repeat colonoscopy in 10 years for screening                            purposes. Procedure Code(s):        --- Professional ---                           [phone], Colonoscopy, flexible; with biopsy, single                            or multiple Diagnosis Code(s):        --- Professional ---                           K52.9, Noninfective gastroenteritis and colitis,  unspecified                           R93.3, Abnormal findings on diagnostic imaging of                            other parts of digestive tract CPT copyright 2016 American [employer]. All rights reserved. The codes documented in this report are preliminary and upon coder review may  be revised to meet current compliance requirements. Hildred Laser, MD Hildred Laser, MD 06/23/2016 9:02:00 AM This report has been signed electronically. Number of Addenda: 0

## 2016-06-23 NOTE — H&P (Signed)
Kristen Mullins is an 41 y.o. female.   Chief Complaint: Patient is here for colonoscopy. HPI: Patient is 41 year old Caucasian female with multiple medical problems including chronic diarrhea. She has undergone fairly extensive workup in the past and felt to have IBS. Testing for celiac disease also been negative. Patient was seen in emergency room about 2 weeks ago. She underwent abdominopelvic CT which revealed thickening to descending colon. She remains with poor appetite nausea and nonbloody diarrhea. He was seen in the office on 06/07/2016 and given prescription for Celexa which she has not been rate. Last colonoscopy in April 2012 was unremarkable.  Past Medical History:  Diagnosis Date           HX  . Anxiety    panic attacks  . Cancer (HCC)    Skin  .       Marland Kitchen Chronic fatigue   . Depression    no meds  . Endometriosis   . External hemorrhoids   . IBS (irritable bowel syndrome) 01/11/2016  . Nausea    History    Past Surgical History:  Procedure Laterality Date  . ABDOMINAL HYSTERECTOMY N/A 07/21/2015   Procedure: HYSTERECTOMY ABDOMINAL;  Surgeon: Florian Buff, MD;  Location: [facility];  Service: Gynecology;  Laterality: N/A;  . BIOPSY N/A 01/27/2016   Procedure: BIOPSY;  Surgeon: Rogene Houston, MD;  [facility];  Service: Endoscopy;  Laterality: N/A;  . COLONOSCOPY  08/25/2010  . diagnosis    . ESOPHAGOGASTRODUODENOSCOPY N/A 01/27/2016   Procedure: ESOPHAGOGASTRODUODENOSCOPY (EGD);  Surgeon: Rogene Houston, MD;  [facility];  Service: Endoscopy;  Laterality: N/A;  9:30  . LAPAROSCOPY N/A 06/23/2013   Procedure: LAPAROSCOPY OPERATIVE AND FULGRATION OF ENDOMETRIOSIS;  Surgeon: Marylynn Pearson, MD;  Location: [facility];  Service: Gynecology;  Laterality: N/A;  cervical laceration repair   . SALPINGOOPHORECTOMY Bilateral 07/21/2015   Procedure: SALPINGO OOPHORECTOMY;  Surgeon: Florian Buff, MD;  Location: [facility];  Service: Gynecology;  Laterality: Bilateral;  .  WISDOM TOOTH EXTRACTION      Family History  Problem Relation Age of Onset  . Diabetes Father   . Cancer Father     lung,skin   Social History:  reports that she has never smoked. She has never used smokeless tobacco. She reports that she does not drink alcohol or use drugs.  Allergies:  Allergies  Allergen Reactions  . Bentyl [Dicyclomine Hcl] Nausea Only  . Hydrocodone Nausea And Vomiting    Medications Prior to Admission  Medication Sig Dispense Refill  . ALPRAZolam (XANAX) 0.5 MG tablet Take 1 tablet (0.5 mg total) by mouth at bedtime. Take 1 tab QHS and additional 1/2 tab daily as needed (Patient taking differently: Take 0.25-0.5 mg by mouth See admin instructions. Take 0.5mg s  at night, may take an additional 0.25mg s as needed for anxiety) 45 tablet 4  . estradiol (ESTRACE) 2 MG tablet Take 1 tablet (2 mg total) by mouth daily. (Patient taking differently: Take 1 mg by mouth daily. ) 90 tablet 3  . Ketotifen Fumarate (ALLERGY EYE DROPS OP) Apply 1 drop to eye daily as needed (allergies).    . promethazine (PHENERGAN) 25 MG tablet Take 1 tablet (25 mg total) by mouth 2 (two) times daily as needed for nausea or vomiting. 20 tablet 0  . tetrahydrozoline-zinc (EYE DROPS ALLERGY RELIEF) 0.05-0.25 % ophthalmic solution 2 drops 3 (three) times daily as needed.    Marland Kitchen acetaminophen (TYLENOL) 500 MG tablet Take 500  mg by mouth every 8 (eight) hours as needed for mild pain, moderate pain or headache. Reported on 05/13/2015    . metoCLOPramide (REGLAN) 10 MG tablet Take 1 tablet (10 mg total) by mouth 4 (four) times daily -  before meals and at bedtime. 30 tablet 0    No results found for this or any previous visit (from the past 48 hour(s)). No results found.  ROS  Blood pressure 111/76, pulse 72, temperature 98.7 F (37.1 C), temperature source Oral, resp. rate 13, height 5\' 6"  (1.676 m), weight 100 lb (45.4 kg), last menstrual period 04/24/2015, SpO2 100 %. Physical Exam   Constitutional:  Well-developed thin Caucasian female in no acute distress. She has generalized muscle wasting.  HENT:  Mouth/Throat: Oropharynx is clear and moist.  Eyes: Conjunctivae are normal. No scleral icterus.  Neck: No thyromegaly present.  Cardiovascular: Normal rate, regular rhythm and normal heart sounds.   No murmur heard. Respiratory: Effort normal and breath sounds normal.  GI:  Abdomen is scaphoid and symmetrical. It is soft with mild tenderness in epigastric region to  Musculoskeletal: She exhibits no edema.  Lymphadenopathy:    She has no cervical adenopathy.  Neurological: She is alert.  Skin: Skin is warm and dry.     Assessment/Plan Chronic diarrhea and abnormal CT and in thickening to descending colon. Diagnostic colonoscopy under monitored anesthesia care.  Hildred Laser, MD 06/23/2016, 8:13 AM

## 2016-06-23 NOTE — Transfer of Care (Signed)
Immediate Anesthesia Transfer of Care Note  Patient: Kristen Mullins  Procedure(s) Performed: Procedure(s) with comments: COLONOSCOPY WITH PROPOFOL (N/A) - 8:15 BIOPSY - proximal sigmoid colon biopsies  Patient Location: PACU  Anesthesia Type:MAC  Level of Consciousness: awake and patient cooperative  Airway & Oxygen Therapy: Patient Spontanous Breathing and non-rebreather face mask  Post-op Assessment: Report given to RN and Post -op Vital signs reviewed and stable  Post vital signs: Reviewed  Last Vitals:  Vitals:   06/23/16 0815 06/23/16 0820  BP: 114/81 113/74  Pulse:    Resp: 16 18  Temp:      Last Pain:  Vitals:   06/23/16 0720  TempSrc: Oral  PainSc: 0-No pain      Patients Stated Pain Goal: 5 (70/01/74 9449)  Complications: No apparent anesthesia complications

## 2016-06-23 NOTE — Anesthesia Preprocedure Evaluation (Signed)
Anesthesia Evaluation  Patient identified by MRN, date of birth, ID band Patient awake    Reviewed: Allergy & Precautions, H&P , NPO status , Patient's Chart, lab work & pertinent test results, reviewed documented beta blocker date and time   History of Anesthesia Complications Negative for: history of anesthetic complications  Airway Mallampati: II  TM Distance: >3 FB Neck ROM: full    Dental  (+) Teeth Intact   Pulmonary neg pulmonary ROS,    Pulmonary exam normal breath sounds clear to auscultation       Cardiovascular Exercise Tolerance: Good negative cardio ROS   Rhythm:regular Rate:Normal     Neuro/Psych PSYCHIATRIC DISORDERS (anxiety, depression, panic attacks) Anxiety Depression negative neurological ROS     GI/Hepatic   Endo/Other    Renal/GU      Musculoskeletal   Abdominal   Peds  Hematology  (+) anemia , REFUSES BLOOD PRODUCTS,   Anesthesia Other Findings   Reproductive/Obstetrics                             Anesthesia Physical Anesthesia Plan  ASA: II  Anesthesia Plan: MAC   Post-op Pain Management:    Induction: Intravenous  Airway Management Planned: Simple Face Mask  Additional Equipment:   Intra-op Plan:   Post-operative Plan:   Informed Consent: I have reviewed the patients History and Physical, chart, labs and discussed the procedure including the risks, benefits and alternatives for the proposed anesthesia with the patient or authorized representative who has indicated his/her understanding and acceptance.     Plan Discussed with:   Anesthesia Plan Comments:         Anesthesia Quick Evaluation

## 2016-06-23 NOTE — Anesthesia Postprocedure Evaluation (Signed)
Anesthesia Post Note  Patient: Kristen Mullins  Procedure(s) Performed: Procedure(s) (LRB): COLONOSCOPY WITH PROPOFOL (N/A) BIOPSY  Patient location during evaluation: PACU Anesthesia Type: MAC Level of consciousness: awake and alert Pain management: pain level controlled Vital Signs Assessment: post-procedure vital signs reviewed and stable Respiratory status: spontaneous breathing Cardiovascular status: stable Anesthetic complications: no     Last Vitals:  Vitals:   06/23/16 0919 06/23/16 0929  BP: 115/80 119/82  Pulse: 75 83  Resp: 11 14  Temp:  36.5 C    Last Pain:  Vitals:   06/23/16 0929  TempSrc: Oral  PainSc:                  Drucie Opitz

## 2016-06-23 NOTE — Anesthesia Procedure Notes (Signed)
Procedure Name: MAC Date/Time: 06/23/2016 8:20 AM Performed by: Vista Deck Pre-anesthesia Checklist: Patient identified, Emergency Drugs available, Suction available, Timeout performed and Patient being monitored Patient Re-evaluated:Patient Re-evaluated prior to inductionOxygen Delivery Method: Non-rebreather mask

## 2016-06-23 NOTE — Discharge Instructions (Signed)
Resume usual medications and diet. Celexa/citalopram 10 mg by mouth daily at bedtime. No driving for 24 hours. Physician will call with biopsy results.   Colonoscopy, Adult, Care After This sheet gives you information about how to care for yourself after your procedure. Your health care provider may also give you more specific instructions. If you have problems or questions, contact your health care provider. What can I expect after the procedure? After the procedure, it is common to have:  A small amount of blood in your stool for 24 hours after the procedure.  Some gas.  Mild abdominal cramping or bloating. Follow these instructions at home: General instructions  For the first 24 hours after the procedure:  Do not drive or use machinery.  Do not sign important documents.  Do not drink alcohol.  Do your regular daily activities at a slower pace than normal.  Eat soft, easy-to-digest foods.  Rest often.  Take over-the-counter or prescription medicines only as told by your health care provider.  It is up to you to get the results of your procedure. Ask your health care provider, or the department performing the procedure, when your results will be ready. Relieving cramping and bloating  Try walking around when you have cramps or feel bloated.  Apply heat to your abdomen as told by your health care provider. Use a heat source that your health care provider recommends, such as a moist heat pack or a heating pad.  Place a towel between your skin and the heat source.  Leave the heat on for 20-30 minutes.  Remove the heat if your skin turns bright red. This is especially important if you are unable to feel pain, heat, or cold. You may have a greater risk of getting burned. Eating and drinking  Drink enough fluid to keep your urine clear or pale yellow.  Resume your normal diet as instructed by your health care provider. Avoid heavy or fried foods that are hard to  digest.  Avoid drinking alcohol for as long as instructed by your health care provider. Contact a health care provider if:  You have blood in your stool 2-3 days after the procedure. Get help right away if:  You have more than a small spotting of blood in your stool.  You pass large blood clots in your stool.  Your abdomen is swollen.  You have nausea or vomiting.  You have a fever.  You have increasing abdominal pain that is not relieved with medicine. This information is not intended to replace advice given to you by your health care provider. Make sure you discuss any questions you have with your health care provider. Document Released: 12/07/2003 Document Revised: 01/17/2016 Document Reviewed: 07/06/2015 Elsevier Interactive Patient Education  2017 [employer]  PATIENT INSTRUCTIONS POST-ANESTHESIA  IMMEDIATELY FOLLOWING SURGERY:  Do not drive or operate machinery for the first twenty four hours after surgery.  Do not make any important decisions for twenty four hours after surgery or while taking narcotic pain medications or sedatives.  If you develop intractable nausea and vomiting or a severe headache please notify your doctor immediately.  FOLLOW-UP:  Please make an appointment with your surgeon as instructed. You do not need to follow up with anesthesia unless specifically instructed to do so.  WOUND CARE INSTRUCTIONS (if applicable):  Keep a dry clean dressing on the anesthesia/puncture wound site if there is drainage.  Once the wound has quit draining you may leave it open to air.  Generally you  should leave the bandage intact for twenty four hours unless there is drainage.  If the epidural site drains for more than 36-48 hours please call the anesthesia department.  QUESTIONS?:  Please feel free to call your physician or the hospital operator if you have any questions, and they will be happy to assist you.

## 2016-06-28 ENCOUNTER — Encounter (HOSPITAL_COMMUNITY): Payer: Self-pay | Admitting: Internal Medicine

## 2016-06-29 ENCOUNTER — Ambulatory Visit (INDEPENDENT_AMBULATORY_CARE_PROVIDER_SITE_OTHER): Payer: 59 | Admitting: Family Medicine

## 2016-06-29 ENCOUNTER — Encounter: Payer: Self-pay | Admitting: Family Medicine

## 2016-06-29 ENCOUNTER — Telehealth (HOSPITAL_COMMUNITY): Payer: Self-pay | Admitting: *Deleted

## 2016-06-29 VITALS — BP 106/76 | HR 48 | Temp 97.6°F | Ht 66.0 in | Wt 94.8 lb

## 2016-06-29 DIAGNOSIS — R636 Underweight: Secondary | ICD-10-CM

## 2016-06-29 DIAGNOSIS — F411 Generalized anxiety disorder: Secondary | ICD-10-CM | POA: Diagnosis not present

## 2016-06-29 DIAGNOSIS — M25549 Pain in joints of unspecified hand: Secondary | ICD-10-CM

## 2016-06-29 MED ORDER — MIRTAZAPINE 15 MG PO TABS: 15.0000 mg | ORAL_TABLET | Freq: Every day | ORAL | 0 refills | Status: DC

## 2016-06-29 MED ORDER — ONDANSETRON HCL 4 MG PO TABS: 4.0000 mg | ORAL_TABLET | Freq: Three times a day (TID) | ORAL | 0 refills | Status: DC | PRN

## 2016-06-29 NOTE — Telephone Encounter (Signed)
attempt to reach patient regarding an appointment, message stated that the person I'm calling is not accepting calls right now.

## 2016-06-29 NOTE — Progress Notes (Signed)
   HPI  Patient presents today here to discuss fatigue and weight loss.  Patient has recent history of worsening chronic fatigue, weight loss, nausea, and diarrhea.  She's undergone recent complete workup with endoscopy, colonoscopy, and CT abdomen and pelvis.  She does have some depression as well as anxiety. She is only taking Xanax currently. She took 1 pill of Celexa and stated that it caused racing heart and she did not like the medication. She takes Xanax at night and one half a pill as needed.  She has an appointment with behavioral health in about 1-2 weeks.  She denies suicidal thoughts.  She's having difficulty tolerating foods because of nausea and diarrhea. He requests some more Phenergan.  PMH: Smoking status noted ROS: Per HPI  Objective: BP 106/76   Pulse (!) 48   Temp 97.6 F (36.4 C) (Oral)   Ht 5\' 6"  (1.676 m)   Wt 94 lb 12.8 oz (43 kg)   LMP 04/24/2015   BMI 15.30 kg/m  Gen: NAD, alert, cooperative with exam HEENT: NCAT, EOMI, PERRL CV: RRR, good S1/S2, no murmur Resp: CTABL, no wheezes, non-labored Abd: SNTND, BS present, no guarding or organomegaly, aortic pulse felt  Ext: No edema, warm Neuro: Alert and oriented, No gross deficits  Abd [place] 8/17- aorta normal  Assessment and plan:  # Generalized anxiety disorder Patient taking benzodiazepines alone Did not tolerate Celexa, Lexapro, or Zoloft Trial of Remeron with decreased appetite and difficulty sleeping I believe she also has uncontrolled depression, she reports racing thoughts with Celexa, consider underlying bipolar disorder Patient has upcoming psychiatry appointment, appreciate their recommendations and evaluation.  # Underweight Likely due to uncontrolled mood disorder Patient has had complete workup for GI issues. Thyroid labs  # Arthralgia Patient concerned about Lyme disease, reassurance provided, however she does have many unexplained symptoms with arthralgias of the hands Many  tick bites in her past Lyme titers    Orders Placed This Encounter  Procedures  . Lyme Ab/Western Blot Reflex  . TSH  . T3, Free  . T4, Free    Meds ordered this encounter  Medications  . mirtazapine (REMERON) 15 MG tablet    Sig: Take 1 tablet (15 mg total) by mouth at bedtime.    Dispense:  30 tablet    Refill:  0  . ondansetron (ZOFRAN) 4 MG tablet    Sig: Take 1 tablet (4 mg total) by mouth every 8 (eight) hours as needed for nausea or vomiting.    Dispense:  20 tablet    Refill:  0    Laroy Apple, MD [facility] 06/29/2016, 5:27 PM

## 2016-06-29 NOTE — Patient Instructions (Signed)
Great to see you!  I think remeron is a good choice for you, start 1 pill once a night, you may take it with xanax.   Be sure to discuss with the behavioral health provider.   We will call with labs within 1 week

## 2016-06-30 LAB — LYME AB/WESTERN BLOT REFLEX: Lyme IgG/IgM Ab: <0.91 {ISR} (ref 0.00–0.90)

## 2016-06-30 LAB — T3, FREE: T3, Free: 2.6 pg/mL (ref 2.0–4.4)

## 2016-06-30 LAB — T4, FREE: FREE T4: 1.22 ng/dL (ref 0.82–1.77)

## 2016-06-30 LAB — TSH: TSH: 0.964 u[IU]/mL (ref 0.450–4.500)
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
## 2016-07-11 ENCOUNTER — Encounter (HOSPITAL_COMMUNITY): Payer: Self-pay | Admitting: Psychiatry

## 2016-07-11 ENCOUNTER — Ambulatory Visit (INDEPENDENT_AMBULATORY_CARE_PROVIDER_SITE_OTHER): Payer: 59 | Admitting: Psychiatry

## 2016-07-11 VITALS — BP 100/76 | HR 74 | Ht 66.0 in | Wt 96.6 lb

## 2016-07-11 DIAGNOSIS — F331 Major depressive disorder, recurrent, moderate: Secondary | ICD-10-CM | POA: Diagnosis not present

## 2016-07-11 DIAGNOSIS — F411 Generalized anxiety disorder: Secondary | ICD-10-CM

## 2016-07-11 DIAGNOSIS — Z888 Allergy status to other drugs, medicaments and biological substances status: Secondary | ICD-10-CM

## 2016-07-11 DIAGNOSIS — Z79899 Other long term (current) drug therapy: Secondary | ICD-10-CM | POA: Diagnosis not present

## 2016-07-11 MED ORDER — MIRTAZAPINE 15 MG PO TABS: ORAL_TABLET | ORAL | 1 refills | Status: DC

## 2016-07-11 NOTE — Patient Instructions (Signed)
1. Start mirtazapine 7.5 mg at night for two weeks, then 15 mg at night 2. Contact for therapy: Dr. Alford Highland Schneidmiller  [phone] [address], [place], [place] [address] 3. Return to clinic in one month
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
## 2016-08-07 ENCOUNTER — Ambulatory Visit (HOSPITAL_COMMUNITY): Payer: Self-pay | Admitting: Psychiatry

## 2016-08-16 ENCOUNTER — Telehealth (HOSPITAL_COMMUNITY): Payer: Self-pay | Admitting: *Deleted

## 2016-08-16 NOTE — Telephone Encounter (Signed)
phone call regarding provider out of office on 08/21/16, patient's husband answered, said she is asleep.  will call  back at later time.

## 2016-08-17 ENCOUNTER — Telehealth (HOSPITAL_COMMUNITY): Payer: Self-pay | Admitting: *Deleted

## 2016-08-17 NOTE — Telephone Encounter (Signed)
voice message from patient at 11:01 a.m., said she is not feeling well and wanted to cancel appointment for 08/21/16.  Returned phone, left voice message for patient to call to reschedule appointment.

## 2016-08-21 ENCOUNTER — Ambulatory Visit (HOSPITAL_COMMUNITY): Payer: Self-pay | Admitting: Psychiatry

## 2016-09-04 ENCOUNTER — Ambulatory Visit (INDEPENDENT_AMBULATORY_CARE_PROVIDER_SITE_OTHER): Payer: 59 | Admitting: Internal Medicine

## 2016-10-23 ENCOUNTER — Telehealth (HOSPITAL_COMMUNITY): Payer: Self-pay | Admitting: *Deleted

## 2016-10-23 NOTE — Telephone Encounter (Signed)
Pt pharmacy [facility] requesting refills for pt Mirtazapine 15 mg QHS. Pt medication last filled on 07-11-2016 with 30 tabs 1 refill. Pt initial appt with provider was 07-11-2016 and was scheduled to f/u on 08-07-2016 and pt no showed. Pt called back to resch her no show appt and was scheduled to f/u with provider on 08-21-2016 and pt called and cancelled appt. Staff left message on pt voicemail when she called to cancel her appt for 08-21-2016 but was unable to reach pt. Staff lm on pt voicemail box to call office to resch appt. Pt do not have any f/u appt with provider. Pharmacy number is [phone].

## 2016-10-24 NOTE — Telephone Encounter (Signed)
No refill unless she has follow up appointment

## 2016-10-26 NOTE — Telephone Encounter (Signed)
lmtcb

## 2016-11-07 ENCOUNTER — Ambulatory Visit (INDEPENDENT_AMBULATORY_CARE_PROVIDER_SITE_OTHER): Payer: 59 | Admitting: Family

## 2016-11-07 ENCOUNTER — Encounter: Payer: Self-pay | Admitting: Family

## 2016-11-07 VITALS — BP 101/76 | HR 89 | Temp 98.3°F | Ht 66.0 in | Wt 103.2 lb

## 2016-11-07 DIAGNOSIS — R636 Underweight: Secondary | ICD-10-CM | POA: Insufficient documentation

## 2016-11-07 DIAGNOSIS — R5383 Other fatigue: Secondary | ICD-10-CM | POA: Diagnosis not present

## 2016-11-07 DIAGNOSIS — G4700 Insomnia, unspecified: Secondary | ICD-10-CM | POA: Insufficient documentation

## 2016-11-07 DIAGNOSIS — F411 Generalized anxiety disorder: Secondary | ICD-10-CM

## 2016-11-07 MED ORDER — MIRTAZAPINE 15 MG PO TABS: ORAL_TABLET | ORAL | 3 refills | Status: DC

## 2016-11-07 MED ORDER — ALPRAZOLAM 0.5 MG PO TABS: 0.2500 mg | ORAL_TABLET | ORAL | 5 refills | Status: DC

## 2016-11-07 NOTE — Patient Instructions (Signed)

## 2016-11-07 NOTE — Progress Notes (Signed)
   Subjective:    Patient ID: Kristen Mullins, female    DOB: 12/18/1975, 41 y.o.   MRN: [id number]  Pt presents to the office today for chronic follow up. PT complaining of chronic fatigue. Anxiety  Presents for follow-up visit. Symptoms include depressed mood, excessive worry, insomnia, irritability, nervous/anxious behavior and restlessness. Symptoms occur most days. The quality of sleep is good.    Insomnia  Primary symptoms: difficulty falling asleep, no frequent awakening, no premature morning awakening.  The current episode started more than one year. The onset quality is gradual. The problem occurs intermittently.      Review of Systems  Constitutional: Positive for irritability.  Psychiatric/Behavioral: The patient is nervous/anxious and has insomnia.   All other systems reviewed and are negative.      Objective:   Physical Exam  Constitutional: She is oriented to person, place, and time. She appears well-developed and well-nourished. No distress.  HENT:  Head: Normocephalic and atraumatic.  Right Ear: External ear normal.  Left Ear: External ear normal.  Nose: Nose normal.  Mouth/Throat: Oropharynx is clear and moist.  Eyes: Pupils are equal, round, and reactive to light.  Neck: Normal range of motion. Neck supple. No thyromegaly present.  Cardiovascular: Normal rate, regular rhythm, normal heart sounds and intact distal pulses.   No murmur heard. Pulmonary/Chest: Effort normal and breath sounds normal. No respiratory distress. She has no wheezes.  Abdominal: Soft. Bowel sounds are normal. She exhibits no distension. There is no tenderness.  Musculoskeletal: Normal range of motion. She exhibits no edema or tenderness.  Neurological: She is alert and oriented to person, place, and time.  Skin: Skin is warm and dry. There is pallor.  Psychiatric: She has a normal mood and affect. Judgment and thought content normal. She is withdrawn.  Vitals reviewed.    BP 101/76    Pulse 89   Temp 98.3 F (36.8 C) (Oral)   Ht '5\' 6"'$  (1.676 m)   Wt 103 lb 3.2 oz (46.8 kg)   LMP 04/24/2015   BMI 16.66 kg/m      Assessment & Plan:  1. Generalized anxiety disorder - ALPRAZolam (XANAX) 0.5 MG tablet; Take 0.5-1 tablets (0.25-0.5 mg total) by mouth See admin instructions. Take 0.'5mg'$ s  at night, may take an additional 0.'25mg'$ s as needed for anxiety  Dispense: 45 tablet; Refill: 5 - CMP14+EGFR  2. Underweight - CMP14+EGFR  3. Insomnia, unspecified type - mirtazapine (REMERON) 15 MG tablet; Take 7.5 mg at night for two weeks, then 15 mg at night  Dispense: 90 tablet; Refill: 3 - CMP14+EGFR  4. Fatigue, unspecified type - CMP14+EGFR - VITAMIN D 25 Hydroxy (Vit-D Deficiency, Fractures)   Continue all meds Labs pending Health Maintenance reviewed Diet and exercise encouraged RTO 6 months   Evelina Dun, FNP

## 2016-11-08 LAB — CMP14+EGFR
ALBUMIN: 4.2 g/dL (ref 3.5–5.5)
ALT: 11 IU/L (ref 0–32)
AST: 17 IU/L (ref 0–40)
Albumin/Globulin Ratio: 1.6 (ref 1.2–2.2)
Alkaline Phosphatase: 80 IU/L (ref 39–117)
BUN / CREAT RATIO: 17 (ref 9–23)
BUN: 15 mg/dL (ref 6–24)
Bilirubin Total: 0.3 mg/dL (ref 0.0–1.2)
CALCIUM: 9.5 mg/dL (ref 8.7–10.2)
CO2: 26 mmol/L (ref 20–29)
CREATININE: 0.9 mg/dL (ref 0.57–1.00)
Chloride: 102 mmol/L (ref 96–106)
GFR, EST AFRICAN AMERICAN: 92 mL/min/{1.73_m2} (ref >59)
GFR, EST NON AFRICAN AMERICAN: 80 mL/min/{1.73_m2} (ref >59)
GLOBULIN, TOTAL: 2.7 g/dL (ref 1.5–4.5)
Glucose: 88 mg/dL (ref 65–99)
Potassium: 4.4 mmol/L (ref 3.5–5.2)
SODIUM: 142 mmol/L (ref 134–144)
Total Protein: 6.9 g/dL (ref 6.0–8.5)

## 2016-11-08 LAB — VITAMIN D 25 HYDROXY (VIT D DEFICIENCY, FRACTURES): Vit D, 25-Hydroxy: 41.9 ng/mL (ref 30.0–100.0)

## 2016-11-20 NOTE — Telephone Encounter (Signed)
Called pt to sch f/u appt. lmtcb and office number provided.

## 2016-11-29 ENCOUNTER — Encounter: Payer: Self-pay | Admitting: Family Medicine

## 2016-11-29 ENCOUNTER — Ambulatory Visit (INDEPENDENT_AMBULATORY_CARE_PROVIDER_SITE_OTHER): Payer: 59 | Admitting: Family Medicine

## 2016-11-29 VITALS — BP 105/75 | HR 78 | Temp 97.8°F | Ht 66.0 in | Wt 109.6 lb

## 2016-11-29 DIAGNOSIS — R599 Enlarged lymph nodes, unspecified: Secondary | ICD-10-CM

## 2016-11-29 NOTE — Patient Instructions (Signed)
Great to see you!  Consider a neti pot or other nasal saline rinse device to help post nasal drip.   If you are not improved in 2 weeks we could try a course of penicillin  If you do not improve within 4-6 weeks we can investigate surgery if needed

## 2016-11-29 NOTE — Progress Notes (Signed)
   HPI  Patient presents today  or evaluation of an enlarged lymph node.  Patient states lymph node is been present for 2 weeks, it's nontender. She's been reading about etiology of enlarged lymph nodes and is concerned about HIV. She has shared a lancet with her father who died of lymphoma. She does not believe he had HIV, however she's just remotely concerned like to be sure.  Her father had lymphoma causing more concern for lymphoma. She's tolerating food and fluids like usual. She denies cough, shortness of breath She has mild sore throat first thing in the morning with frequent throat clearing, she believes she has postnasal drip.  Prefers penicillin if needed  PMH: Smoking status noted ROS: Per HPI  Objective: BP 105/75   Pulse 78   Temp 97.8 F (36.6 C) (Oral)   Ht 5\' 6"  (1.676 m)   Wt 109 lb 9.6 oz (49.7 kg)   LMP 04/24/2015   BMI 17.69 kg/m  Gen: NAD, alert, cooperative with exam HEENT: NCAT, small, approximately 5 mm firm mobile nontender lymph node in the cervical chain just posterior to the SCM on the right side, oropharynx moist and clear, TMs normal bilaterally CV: RRR, good S1/S2, no murmur Resp: CTABL, no wheezes, non-labored Ext: No edema, warm Neuro: Alert and oriented, No gross deficits  Assessment and plan:  # Enlarged lymph node Patient anxious about HIV, checking HIV today, also CBC. Likely transient viral infection, if not improved in 2 weeks would go ahead and treat with a course of penicillin to cover strep. With family history of lymphoma would recommend referral to ENT for consideration of biopsy if not improved after 4-6 weeks.      Orders Placed This Encounter  Procedures  . CBC with Differential/Platelet  . HIV antibody    Laroy Apple, MD [facility] 11/29/2016, 5:07 PM

## 2016-11-30 LAB — CBC WITH DIFFERENTIAL/PLATELET
BASOS ABS: 0.1 10*3/uL (ref 0.0–0.2)
Basos: 1 %
EOS (ABSOLUTE): 0.1 10*3/uL (ref 0.0–0.4)
Eos: 2 %
HEMOGLOBIN: 13.2 g/dL (ref 11.1–15.9)
Hematocrit: 39.8 % (ref 34.0–46.6)
Immature Grans (Abs): 0 10*3/uL (ref 0.0–0.1)
Immature Granulocytes: 0 %
LYMPHS ABS: 1.4 10*3/uL (ref 0.7–3.1)
Lymphs: 22 %
MCH: 29.5 pg (ref 26.6–33.0)
MCHC: 33.2 g/dL (ref 31.5–35.7)
MCV: 89 fL (ref 79–97)
MONOS ABS: 0.6 10*3/uL (ref 0.1–0.9)
Monocytes: 10 %
NEUTROS ABS: 4.3 10*3/uL (ref 1.4–7.0)
Neutrophils: 65 %
Platelets: 218 10*3/uL (ref 150–379)
RBC: 4.48 x10E6/uL (ref 3.77–5.28)
RDW: 13 % (ref 12.3–15.4)
WBC: 6.5 10*3/uL (ref 3.4–10.8)

## 2016-11-30 LAB — HIV ANTIBODY (ROUTINE TESTING W REFLEX): HIV SCREEN 4TH GENERATION: NONREACTIVE

## 2017-02-23 ENCOUNTER — Ambulatory Visit (INDEPENDENT_AMBULATORY_CARE_PROVIDER_SITE_OTHER): Payer: 59 | Admitting: Family Medicine

## 2017-02-23 ENCOUNTER — Encounter: Payer: Self-pay | Admitting: Family Medicine

## 2017-02-23 VITALS — BP 107/75 | HR 66 | Temp 97.0°F | Ht 66.0 in | Wt 124.6 lb

## 2017-02-23 DIAGNOSIS — L237 Allergic contact dermatitis due to plants, except food: Secondary | ICD-10-CM

## 2017-02-23 MED ORDER — TRIAMCINOLONE ACETONIDE 40 MG/ML IJ SUSP
40.0000 mg | Freq: Once | INTRAMUSCULAR | Status: AC
  Administered 2017-02-23: 40 mg via INTRAMUSCULAR

## 2017-02-23 NOTE — Progress Notes (Signed)
   HPI  Patient presents today here with concern for poison ivy.  Patient explains that she did yard work about 3 days ago with uncovered skin and has characteristic rash of poison oak exposure.  She states that the rash seems to be spreading across her right breast, bilateral legs.  PMH: Smoking status noted ROS: Per HPI  Objective: BP 107/75   Pulse 66   Temp (!) 97 F (36.1 C) (Oral)   Ht 5\' 6"  (1.676 m)   Wt 124 lb 9.6 oz (56.5 kg)   LMP 04/24/2015   BMI 20.11 kg/m  Gen: NAD, alert, cooperative with exam HEENT: NCAT CV: RRR, good S1/S2, no murmur Resp: CTABL, no wheezes, non-labored Ext: No edema, warm Neuro: Alert and oriented, No gross deficits Skin:  Erythematous rash on right forearm with vesicular features    Assessment and plan:  #Contact dermatitis, poison dermatitis Patient is given IM Kenalog today given spread across her chest and bilateral legs. Discussed supportive care, offered Kenalog ointment and steroid taper if symptoms return    Meds ordered this encounter  Medications  . triamcinolone acetonide (KENALOG-40) injection 40 mg    Laroy Apple, MD [place] 02/23/2017, 8:29 AM

## 2017-02-23 NOTE — Patient Instructions (Signed)
Great to see you!  Call, with any problems.

## 2017-03-19 IMAGING — NM NM GASTRIC EMPTYING
6 series · 6 of 6 positions shown · non-contrast
Comparison: None.

CLINICAL DATA: Chronic nausea.

EXAM:
NUCLEAR MEDICINE GASTRIC EMPTYING SCAN
TECHNIQUE: After oral ingestion of radiolabeled meal, sequential abdominal
images were obtained for 120 minutes. Residual percentage of
activity remaining within the stomach was calculated at 60 and 120
minutes.
RADIOPHARMACEUTICALS:  2.0 mCi Fc-66m sulfur colloid in standardized
meal

[Series 1: 0 min · 4.14mm/px · 1 of 1 slices shown (1 of 2)]
[im 1/1]
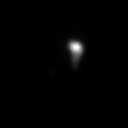

[Series 1: 0 min · 4.14mm/px · 1 of 1 slices shown (2 of 2)]
[im 1/1]
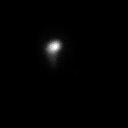

[Series 1: 120 min · 4.14mm/px · 1 of 1 slices shown (1 of 2)]
[im 1/1]
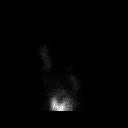

[Series 1: 120 min · 4.14mm/px · 1 of 1 slices shown (2 of 2)]
[im 1/1]
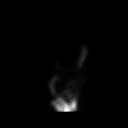

[Series 2: 60 min · 4.14mm/px · 1 of 1 slices shown (1 of 2)]
[im 1/1]
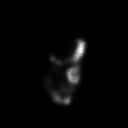

[Series 2: 60 min · 4.14mm/px · 1 of 1 slices shown (2 of 2)]
[im 1/1]
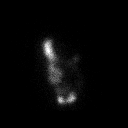

[6 of 6 positions shown; findings below may reference images not displayed]

FINDINGS: Expected location of the stomach in the left upper quadrant.
Ingested meal empties the stomach gradually over the course of the
study with 40.57% retention at 60 min and 8.88% retention at 120 min
(normal retention less than 30% at a 120 min).
IMPRESSION: Normal gastric emptying study.

## 2017-06-19 ENCOUNTER — Telehealth: Payer: Self-pay | Admitting: Family Medicine

## 2017-06-22 ENCOUNTER — Ambulatory Visit (INDEPENDENT_AMBULATORY_CARE_PROVIDER_SITE_OTHER): Payer: 59 | Admitting: Family Medicine

## 2017-06-22 ENCOUNTER — Encounter: Payer: Self-pay | Admitting: Family Medicine

## 2017-06-22 VITALS — BP 111/79 | HR 81 | Temp 97.3°F | Ht 66.0 in | Wt 134.6 lb

## 2017-06-22 DIAGNOSIS — G4700 Insomnia, unspecified: Secondary | ICD-10-CM

## 2017-06-22 DIAGNOSIS — F331 Major depressive disorder, recurrent, moderate: Secondary | ICD-10-CM | POA: Diagnosis not present

## 2017-06-22 DIAGNOSIS — Z1231 Encounter for screening mammogram for malignant neoplasm of breast: Secondary | ICD-10-CM | POA: Diagnosis not present

## 2017-06-22 DIAGNOSIS — F411 Generalized anxiety disorder: Secondary | ICD-10-CM | POA: Diagnosis not present

## 2017-06-22 DIAGNOSIS — Z1239 Encounter for other screening for malignant neoplasm of breast: Secondary | ICD-10-CM

## 2017-06-22 MED ORDER — MIRTAZAPINE 15 MG PO TABS: ORAL_TABLET | ORAL | 3 refills | Status: DC

## 2017-06-22 MED ORDER — ALPRAZOLAM 0.5 MG PO TABS: ORAL_TABLET | ORAL | 5 refills | Status: DC

## 2017-06-22 NOTE — Patient Instructions (Signed)
Great to see you!   

## 2017-06-22 NOTE — Progress Notes (Signed)
   HPI  Patient presents today here to follow-up for anxiety and depression.  Patient states that she feels well.  She is taking mirtazapine every night.  She takes Xanax at night and an extra half a pill whenever she needs it for social anxiety.  She is interested in getting mammograms starting now.  Her father's mother, paternal grandmother, died at age 43 from breast cancer.  She is sleeping well with the current combination.   PMH: Smoking status noted ROS: Per HPI  Objective: BP 111/79   Pulse 81   Temp (!) 97.3 F (36.3 C) (Oral)   Ht 5\' 6"  (1.676 m)   Wt 134 lb 9.6 oz (61.1 kg)   LMP 04/24/2015   BMI 21.73 kg/m  Gen: NAD, alert, cooperative with exam HEENT: NCAT, TMs normal bilaterally, nares clear, oropharynx moist and clear CV: RRR, good S1/S2, no murmur Resp: CTABL, no wheezes, non-labored Abd: SNTND, BS present, no guarding or organomegaly Ext: No edema, warm Neuro: Alert and oriented, No gross deficits  Assessment and plan:  # GAD, depression Doing well with remeron + xanax, refilled both No SI PHQ-9 score 6 today  # Screening for breast cancer- mammo orderd and discussed.   # Insomnia Doing well with above meds- refilled.     Orders Placed This Encounter  Procedures  . MM Digital Screening    Standing Status:   Future    Standing Expiration Date:   08/21/2018    Scheduling Instructions:     [facility] bus    Order Specific Question:   Reason for Exam (SYMPTOM  OR DIAGNOSIS REQUIRED)    Answer:   screening    Order Specific Question:   Is the patient pregnant?    Answer:   No    Order Specific Question:   Preferred imaging location?    Answer:   External    Meds ordered this encounter  Medications  . ALPRAZolam (XANAX) 0.5 MG tablet    Sig: Take 0.5mg s  at night, may take an additional 0.25mg s as needed for anxiety    Dispense:  45 tablet    Refill:  5  . mirtazapine (REMERON) 15 MG tablet    Sig: Take 7.5 mg at night for two weeks, then 15 mg  at night    Dispense:  90 tablet    Refill:  [place], MD [facility] 06/22/2017, 10:43 AM

## 2017-07-30 LAB — HM MAMMOGRAPHY

## 2017-08-15 ENCOUNTER — Telehealth: Payer: Self-pay | Admitting: Family Medicine

## 2017-08-15 NOTE — Telephone Encounter (Signed)
Pt given results of mammogram and informed that she will be receiving a letter from the imaging facility with results also.

## 2017-10-31 ENCOUNTER — Telehealth: Payer: Self-pay | Admitting: Family Medicine

## 2017-10-31 ENCOUNTER — Other Ambulatory Visit: Payer: Self-pay | Admitting: Physician Assistant

## 2017-10-31 DIAGNOSIS — F411 Generalized anxiety disorder: Secondary | ICD-10-CM

## 2017-10-31 MED ORDER — ALPRAZOLAM 0.5 MG PO TABS: ORAL_TABLET | ORAL | 0 refills | Status: DC

## 2017-10-31 NOTE — Telephone Encounter (Signed)
Patient aware.

## 2017-10-31 NOTE — Telephone Encounter (Signed)
I sent in 1 month refill for the medication.

## 2017-12-21 ENCOUNTER — Encounter: Payer: Self-pay | Admitting: Pediatrics

## 2017-12-21 ENCOUNTER — Ambulatory Visit (INDEPENDENT_AMBULATORY_CARE_PROVIDER_SITE_OTHER): Payer: 59 | Admitting: Pediatrics

## 2017-12-21 DIAGNOSIS — G4700 Insomnia, unspecified: Secondary | ICD-10-CM | POA: Diagnosis not present

## 2017-12-21 DIAGNOSIS — F411 Generalized anxiety disorder: Secondary | ICD-10-CM | POA: Diagnosis not present

## 2017-12-21 MED ORDER — ALPRAZOLAM 0.5 MG PO TABS
ORAL_TABLET | ORAL | 5 refills | Status: DC
Start: 2017-12-21 — End: 2018-03-28

## 2017-12-21 MED ORDER — MIRTAZAPINE 15 MG PO TABS: ORAL_TABLET | ORAL | 3 refills | Status: DC

## 2017-12-21 NOTE — Progress Notes (Signed)
  Subjective:   Patient ID: Kristen Mullins, female    DOB: 09-07-1975, 42 y.o.   MRN: [id number] CC: Medication Refill  HPI: Kristen Mullins is a 42 y.o. female   Anxiety: Primarily social anxiety.  Takes Xanax once a day most evenings.  Extra half tab of Xanax for social situations during the day.   Depressed mood: Recent death of her father several months ago. Doing better now.  Appetite was down for several weeks.  Appetite much improved now.  Taking Remeron every evening.  Thinks her mood has been doing much better.  She is a history of chronic fatigue syndrome.  Trying to keep with regular exercise schedule.  The hot weather has made it tough.  Relevant past medical, surgical, family and social history reviewed. Allergies and medications reviewed and updated. Social History   Tobacco Use  Smoking Status Never Smoker  Smokeless Tobacco Never Used   ROS: Per HPI   Objective:    BP 108/75   Pulse 74   Temp 98.7 F (37.1 C) (Oral)   Ht 5\' 6"  (1.676 m)   Wt 148 lb 3.2 oz (67.2 kg)   LMP 04/24/2015   BMI 23.92 kg/m   Wt Readings from Last 3 Encounters:  12/21/17 148 lb 3.2 oz (67.2 kg)  06/22/17 134 lb 9.6 oz (61.1 kg)  02/23/17 124 lb 9.6 oz (56.5 kg)    Gen: NAD, alert, cooperative with exam, NCAT EYES: EOMI, no conjunctival injection, or no icterus ENT:  TMs pearly gray b/l, OP without erythema LYMPH: no cervical LAD CV: NRRR, normal S1/S2, no murmur, distal pulses 2+ b/l Resp: CTABL, no wheezes, normal WOB Ext: No edema, warm Neuro: Alert and oriented, strength equal b/l UE and LE, coordination grossly normal MSK: normal muscle bulk  Assessment & Plan:  Jemiah was seen today for medication refill.  Diagnoses and all orders for this visit:  Generalized anxiety disorder Stable, continue below.  Has been on for some years with good improvement in symptoms. -     ALPRAZolam (XANAX) 0.5 MG tablet; Take 0.5mg s  at night, may take an additional 0.25mg s as needed for  anxiety  Insomnia, unspecified type Stable, continue below. -     mirtazapine (REMERON) 15 MG tablet; Take 15mg  at night   Follow up plan: Return in about 6 months (around 06/23/2018). Assunta Found, MD [employer]

## 2017-12-31 ENCOUNTER — Other Ambulatory Visit: Payer: Self-pay | Admitting: Dermatology

## 2018-03-28 ENCOUNTER — Ambulatory Visit (INDEPENDENT_AMBULATORY_CARE_PROVIDER_SITE_OTHER): Payer: 59 | Admitting: Pediatrics

## 2018-03-28 ENCOUNTER — Encounter: Payer: Self-pay | Admitting: Pediatrics

## 2018-03-28 VITALS — BP 108/76 | HR 72 | Temp 97.1°F | Ht 66.0 in | Wt 151.0 lb

## 2018-03-28 DIAGNOSIS — F411 Generalized anxiety disorder: Secondary | ICD-10-CM | POA: Diagnosis not present

## 2018-03-28 DIAGNOSIS — F331 Major depressive disorder, recurrent, moderate: Secondary | ICD-10-CM | POA: Diagnosis not present

## 2018-03-28 MED ORDER — ALPRAZOLAM 0.25 MG PO TABS: ORAL_TABLET | ORAL | 5 refills | Status: DC

## 2018-03-28 NOTE — Patient Instructions (Signed)
When starting taper: Take 1 tablets (0.25mg ) tablets of xanax at night for next 2-3 weeks. Then decrease to half tablet at night. Let me know if having trouble with taper, can consider longer-acting medicine. If anxiety worsening we may need to add daily anxiety controlling medication.  Mental Health Apps and Websites Here are a few free apps meant to help you to help yourself.  To find, try searching on the internet to see if the app is offered on Apple/Android devices. If your first choice doesn't come up on your device, the good news is that there are many choices! Play around with different apps to see which ones are helpful to you . Calm This is an app meant to help increase calm feelings. Includes info, strategies, and tools for tracking your feelings.   Calm Harm  This app is meant to help with self-harm. Provides many 5-minute or 15-min coping strategies for doing instead of hurting yourself.    [place] is a problem-solving tool to help deal with emotions and cope with stress you encounter wherever you are.    MindShift This app can help people cope with anxiety. Rather than trying to avoid anxiety, you can make an important shift and face it.    MY3  MY3 features a support system, safety plan and resources with the goal of offering a tool to use in a time of need.    My Life My Voice  This mood journal offers a simple solution for tracking your thoughts, feelings and moods. Animated emoticons can help identify your mood.   Relax Melodies Designed to help with sleep, on this app you can mix sounds and meditations for relaxation.    Smiling Mind Smiling Mind is meditation made easy: it's a simple tool that helps put a smile on your mind.    Stop, Breathe & Think  A friendly, simple guide for people through meditations for mindfulness and compassion.  Stop, Breathe and Think Kids Enter your current feelings and choose a "mission" to help you cope. Offers  videos for certain moods instead of just sound recordings.     The [place] Box The [place] Box (VHB) contains simple tools to help patients with coping, relaxation, distraction, and positive thinking.

## 2018-03-28 NOTE — Progress Notes (Signed)
  Subjective:   Patient ID: Kristen Mullins, female    DOB: 09-17-1975, 42 y.o.   MRN: [id number] CC: Medical Management of Chronic Issues  HPI: Kristen Mullins is a 41 y.o. female   Here today with her husband to discuss anxiety.  Xanax recently on back order at [facility].  She says she is interested in weaning off of the Xanax.  She has been on it for about the last 5 to 6 years.  For about the last few weeks she is not needed to take a daytime extra Xanax.  She has been taking 0.5 mg at night, for the last 2 nights she took half a tab because she was worried about running out.  For depression she takes mirtazapine at night.  Relevant past medical, surgical, family and social history reviewed. Allergies and medications reviewed and updated. Social History   Tobacco Use  Smoking Status Never Smoker  Smokeless Tobacco Never Used   ROS: Per HPI   Objective:    BP 108/76   Pulse 72   Temp (!) 97.1 F (36.2 C) (Oral)   Ht 5\' 6"  (1.676 m)   Wt 151 lb (68.5 kg)   LMP 04/24/2015   BMI 24.37 kg/m   Wt Readings from Last 3 Encounters:  03/28/18 151 lb (68.5 kg)  12/21/17 148 lb 3.2 oz (67.2 kg)  06/22/17 134 lb 9.6 oz (61.1 kg)    Gen: NAD, alert, cooperative with exam, NCAT EYES: EOMI, no conjunctival injection, or no icterus ENT:  OP without erythema LYMPH: no cervical LAD CV: NRRR, normal S1/S2, no murmur, distal pulses 2+ b/l Resp: CTABL, no wheezes, normal WOB Abd: +BS, soft, NTND. no guarding or organomegaly Ext: No edema, warm Neuro: Alert and oriented, strength equal b/l UE and LE, coordination grossly normal Psych: Normal affect, feels safe at home.  No thoughts of self-harm.  Assessment & Plan:  Kristen Mullins was seen today for medical management of chronic issues.  Diagnoses and all orders for this visit:  Moderate episode of recurrent major depressive disorder (HCC) Stable, continue mirtazapine  Generalized anxiety disorder Had been receiving 0.5 mg tablets, decreased strength down  to 0.25 mg tablets today, with same mg given in the month.  She was told that all the pharmacies in the area had 0.5 mg tablets on back order.  Patient is interested in taper in future.  She is not sure if she is ready right now to start.  Discussed what next steps would be, taking 0.25 mg at night rather than 0.5mg  as she has been doing recently.  Okay to go slow with the taper.  We could also consider switching to longer acting benzodiazepine for taper.  If anxiety worsens, may benefit from start of SSRI -     : ALPRAZolam (XANAX) 0.25 MG tablet; Take 0.5mg s  at night, may take an additional 0.25mg s as needed for anxiety   Follow up plan: Return in about 3 months (around 06/28/2018). Assunta Found, MD [place]

## 2018-03-29 ENCOUNTER — Telehealth: Payer: Self-pay | Admitting: Pediatrics

## 2018-03-29 DIAGNOSIS — F411 Generalized anxiety disorder: Secondary | ICD-10-CM

## 2018-03-29 MED ORDER — ALPRAZOLAM 1 MG PO TABS: 0.5000 mg | ORAL_TABLET | Freq: Every evening | ORAL | 5 refills | Status: DC | PRN

## 2018-03-29 MED ORDER — ALPRAZOLAM 0.25 MG PO TABS: ORAL_TABLET | ORAL | 5 refills | Status: DC

## 2018-03-29 NOTE — Telephone Encounter (Signed)
Patient aware.

## 2018-03-29 NOTE — Telephone Encounter (Signed)
PT states that [facility]'t [facility] AL[facility]am (XANAX) 0.25 MG tablet in stock can we please send in refill to [facility]

## 2018-03-29 NOTE — Telephone Encounter (Signed)
Okay to call in refill.

## 2018-03-29 NOTE — Addendum Note (Signed)
Addended by: Wardell Heath on: 03/29/2018 02:19 PM   Modules accepted: Orders

## 2018-06-24 ENCOUNTER — Ambulatory Visit: Payer: 59 | Admitting: Pediatrics

## 2019-02-04 ENCOUNTER — Other Ambulatory Visit: Payer: Self-pay

## 2019-02-06 ENCOUNTER — Ambulatory Visit (INDEPENDENT_AMBULATORY_CARE_PROVIDER_SITE_OTHER): Payer: 59 | Admitting: Family

## 2019-02-06 ENCOUNTER — Encounter: Payer: Self-pay | Admitting: Family

## 2019-02-06 ENCOUNTER — Other Ambulatory Visit: Payer: Self-pay

## 2019-02-06 VITALS — BP 104/76 | HR 70 | Temp 97.7°F | Wt 155.6 lb

## 2019-02-06 DIAGNOSIS — F411 Generalized anxiety disorder: Secondary | ICD-10-CM

## 2019-02-06 DIAGNOSIS — G4700 Insomnia, unspecified: Secondary | ICD-10-CM | POA: Diagnosis not present

## 2019-02-06 DIAGNOSIS — F331 Major depressive disorder, recurrent, moderate: Secondary | ICD-10-CM | POA: Diagnosis not present

## 2019-02-06 DIAGNOSIS — Z79899 Other long term (current) drug therapy: Secondary | ICD-10-CM

## 2019-02-06 DIAGNOSIS — R5383 Other fatigue: Secondary | ICD-10-CM | POA: Diagnosis not present

## 2019-02-06 MED ORDER — BUSPIRONE HCL 5 MG PO TABS: 5.0000 mg | ORAL_TABLET | Freq: Three times a day (TID) | ORAL | 2 refills | Status: DC

## 2019-02-06 MED ORDER — ALPRAZOLAM 0.25 MG PO TABS: ORAL_TABLET | ORAL | 5 refills | Status: DC

## 2019-02-06 MED ORDER — MIRTAZAPINE 15 MG PO TABS: ORAL_TABLET | ORAL | 3 refills | Status: DC

## 2019-02-06 NOTE — Patient Instructions (Signed)

## 2019-02-06 NOTE — Progress Notes (Signed)
Subjective:    Patient ID: Kristen Mullins, female    DOB: 05-Feb-1976, 43 y.o.   MRN: [id number]  Chief Complaint  Patient presents with  . Medical Management of Chronic Issues   Pt presents to the office today for chronic follow up and medication refill. She reports she has been trying to wean off her xanax and has been taking 1/4 of a tablet of the 0.25 mg. She states some days her anxiety is increased and has to take 1/2 of the 0.25 mg.   She does report increase in fatigue over the last several months.  Anxiety Presents for follow-up visit. Symptoms include decreased concentration, excessive worry, insomnia, nervous/anxious behavior and restlessness. Symptoms occur occasionally. The severity of symptoms is moderate. The quality of sleep is good.    Depression        This is a chronic problem.  The current episode started more than 1 year ago.   The onset quality is gradual.   The problem occurs intermittently.  Associated symptoms include decreased concentration, insomnia and restlessness.  Past medical history includes anxiety.   Insomnia Primary symptoms: difficulty falling asleep.  The current episode started more than one year. The onset quality is gradual. The problem occurs intermittently. PMH includes: depression.      Review of Systems  Psychiatric/Behavioral: Positive for decreased concentration and depression. The patient is nervous/anxious and has insomnia.   All other systems reviewed and are negative.      Objective:   Physical Exam Vitals signs reviewed.  Constitutional:      General: She is not in acute distress.    Appearance: She is well-developed.  HENT:     Head: Normocephalic and atraumatic.     Right Ear: Tympanic membrane normal.     Left Ear: Tympanic membrane normal.  Eyes:     Pupils: Pupils are equal, round, and reactive to light.  Neck:     Musculoskeletal: Normal range of motion and neck supple.     Thyroid: No thyromegaly.  Cardiovascular:      Rate and Rhythm: Normal rate and regular rhythm.     Heart sounds: Normal heart sounds. No murmur.  Pulmonary:     Effort: Pulmonary effort is normal. No respiratory distress.     Breath sounds: Normal breath sounds. No wheezing.  Abdominal:     General: Bowel sounds are normal. There is no distension.     Palpations: Abdomen is soft.     Tenderness: There is no abdominal tenderness.  Musculoskeletal: Normal range of motion.        General: No tenderness.  Skin:    General: Skin is warm and dry.  Neurological:     Mental Status: She is alert and oriented to person, place, and time.     Cranial Nerves: No cranial nerve deficit.     Deep Tendon Reflexes: Reflexes are normal and symmetric.  Psychiatric:        Behavior: Behavior normal.        Thought Content: Thought content normal.        Judgment: Judgment normal.       BP 104/76   Pulse 70   Temp 97.7 F (36.5 C) (Temporal)   Wt 155 lb 9.6 oz (70.6 kg)   LMP 04/24/2015   SpO2 100%   BMI 25.11 kg/m      Assessment & Plan:  Kristen Mullins comes in today with chief complaint of Medical Management of Chronic Issues  Diagnosis and orders addressed:  1. Generalized anxiety disorder Will add Buspar 5 mg today as she wants to wean off xanax  [place] controlled database reviewed, no red flags.  - busPIRone (BUSPAR) 5 MG tablet; Take 1 tablet (5 mg total) by mouth 3 (three) times daily.  Dispense: 90 tablet; Refill: 2 - Anemia Profile B - CMP14+EGFR - TSH - ALPRAZolam (XANAX) 0.25 MG tablet; Take 0.42ms  at night, may take an additional 0.282m as needed for anxiety  Dispense: 75 tablet; Refill: 5 - ToxASSURE Select 13 (MW), Urine  2. Moderate episode of recurrent major depressive disorder (HCC) - busPIRone (BUSPAR) 5 MG tablet; Take 1 tablet (5 mg total) by mouth 3 (three) times daily.  Dispense: 90 tablet; Refill: 2 - Anemia Profile B - CMP14+EGFR - TSH  3. Insomnia, unspecified type - busPIRone (BUSPAR) 5 MG tablet;  Take 1 tablet (5 mg total) by mouth 3 (three) times daily.  Dispense: 90 tablet; Refill: 2 - Anemia Profile B - CMP14+EGFR - TSH - mirtazapine (REMERON) 15 MG tablet; Take 1527mt night  Dispense: 90 tablet; Refill: 3 - ToxASSURE Select 13 (MW), Urine  4. Fatigue, unspecified type - Anemia Profile B - CMP14+EGFR - TSH - VITAMIN D 25 Hydroxy (Vit-D Deficiency, Fractures)  5. Controlled substance agreement signed - ToxASSURE Select 13 (MW), Urine   Labs pending Health Maintenance reviewed Diet and exercise encouraged  Follow up plan: 6 months    ChrEvelina DunNP

## 2019-02-07 ENCOUNTER — Other Ambulatory Visit: Payer: Self-pay | Admitting: Family

## 2019-02-07 DIAGNOSIS — E538 Deficiency of other specified B group vitamins: Secondary | ICD-10-CM | POA: Insufficient documentation

## 2019-02-07 DIAGNOSIS — E559 Vitamin D deficiency, unspecified: Secondary | ICD-10-CM | POA: Insufficient documentation

## 2019-02-07 LAB — ANEMIA PROFILE B
Basophils Absolute: 0.1 10*3/uL (ref 0.0–0.2)
Basos: 1 %
EOS (ABSOLUTE): 0.1 10*3/uL (ref 0.0–0.4)
Eos: 2 %
Ferritin: 41 ng/mL (ref 15–150)
Folate: 10.2 ng/mL (ref >3.0)
Hematocrit: 39.8 % (ref 34.0–46.6)
Hemoglobin: 13.4 g/dL (ref 11.1–15.9)
Immature Grans (Abs): 0 10*3/uL (ref 0.0–0.1)
Immature Granulocytes: 0 %
Iron Saturation: 19 % (ref 15–55)
Iron: 62 ug/dL (ref 27–159)
Lymphocytes Absolute: 1.4 10*3/uL (ref 0.7–3.1)
Lymphs: 25 %
MCH: 28.6 pg (ref 26.6–33.0)
MCHC: 33.7 g/dL (ref 31.5–35.7)
MCV: 85 fL (ref 79–97)
Monocytes Absolute: 0.5 10*3/uL (ref 0.1–0.9)
Monocytes: 9 %
Neutrophils Absolute: 3.6 10*3/uL (ref 1.4–7.0)
Neutrophils: 63 %
Platelets: 254 10*3/uL (ref 150–450)
RBC: 4.68 x10E6/uL (ref 3.77–5.28)
RDW: 12.9 % (ref 11.7–15.4)
Retic Ct Pct: 1.4 % (ref 0.6–2.6)
Total Iron Binding Capacity: 335 ug/dL (ref 250–450)
UIBC: 273 ug/dL (ref 131–425)
Vitamin B-12: 212 pg/mL — ABNORMAL LOW (ref 232–1245)
WBC: 5.8 10*3/uL (ref 3.4–10.8)

## 2019-02-07 LAB — CMP14+EGFR
ALT: 12 IU/L (ref 0–32)
AST: 17 IU/L (ref 0–40)
Albumin/Globulin Ratio: 1.7 (ref 1.2–2.2)
Albumin: 4.3 g/dL (ref 3.8–4.8)
Alkaline Phosphatase: 126 IU/L — ABNORMAL HIGH (ref 39–117)
BUN/Creatinine Ratio: 13 (ref 9–23)
BUN: 11 mg/dL (ref 6–24)
Bilirubin Total: 0.2 mg/dL (ref 0.0–1.2)
CO2: 24 mmol/L (ref 20–29)
Calcium: 9.8 mg/dL (ref 8.7–10.2)
Chloride: 104 mmol/L (ref 96–106)
Creatinine, Ser: 0.83 mg/dL (ref 0.57–1.00)
GFR calc Af Amer: 100 mL/min/{1.73_m2} (ref >59)
GFR calc non Af Amer: 87 mL/min/{1.73_m2} (ref >59)
Globulin, Total: 2.6 g/dL (ref 1.5–4.5)
Glucose: 89 mg/dL (ref 65–99)
Potassium: 4.5 mmol/L (ref 3.5–5.2)
Sodium: 143 mmol/L (ref 134–144)
Total Protein: 6.9 g/dL (ref 6.0–8.5)

## 2019-02-07 LAB — TSH: TSH: 1.83 u[IU]/mL (ref 0.450–4.500)

## 2019-02-07 LAB — VITAMIN D 25 HYDROXY (VIT D DEFICIENCY, FRACTURES): Vit D, 25-Hydroxy: 24.4 ng/mL — ABNORMAL LOW (ref 30.0–100.0)

## 2019-02-07 MED ORDER — VITAMIN D (ERGOCALCIFEROL) 1.25 MG (50000 UNIT) PO CAPS: 50000.0000 [IU] | ORAL_CAPSULE | ORAL | 3 refills | Status: DC

## 2019-02-09 LAB — SPECIMEN STATUS REPORT

## 2019-02-09 LAB — GAMMA GT: GGT: 9 IU/L (ref 0–60)

## 2019-02-10 ENCOUNTER — Other Ambulatory Visit: Payer: Self-pay

## 2019-02-10 DIAGNOSIS — R5383 Other fatigue: Secondary | ICD-10-CM

## 2019-02-10 LAB — TOXASSURE SELECT 13 (MW), URINE

## 2019-03-01 ENCOUNTER — Other Ambulatory Visit: Payer: Self-pay | Admitting: Family

## 2019-03-01 DIAGNOSIS — F331 Major depressive disorder, recurrent, moderate: Secondary | ICD-10-CM

## 2019-03-01 DIAGNOSIS — G4700 Insomnia, unspecified: Secondary | ICD-10-CM

## 2019-03-01 DIAGNOSIS — F411 Generalized anxiety disorder: Secondary | ICD-10-CM

## 2019-05-27 ENCOUNTER — Other Ambulatory Visit: Payer: Self-pay | Admitting: Family

## 2019-05-27 DIAGNOSIS — F411 Generalized anxiety disorder: Secondary | ICD-10-CM

## 2019-05-27 DIAGNOSIS — F331 Major depressive disorder, recurrent, moderate: Secondary | ICD-10-CM

## 2019-05-27 DIAGNOSIS — G4700 Insomnia, unspecified: Secondary | ICD-10-CM

## 2019-05-30 ENCOUNTER — Ambulatory Visit (INDEPENDENT_AMBULATORY_CARE_PROVIDER_SITE_OTHER): Payer: 59

## 2019-05-30 ENCOUNTER — Other Ambulatory Visit: Payer: Self-pay

## 2019-05-30 DIAGNOSIS — R5383 Other fatigue: Secondary | ICD-10-CM | POA: Diagnosis not present

## 2019-06-03 ENCOUNTER — Other Ambulatory Visit: Payer: Self-pay | Admitting: Family

## 2019-06-03 DIAGNOSIS — M8580 Other specified disorders of bone density and structure, unspecified site: Secondary | ICD-10-CM | POA: Insufficient documentation

## 2019-12-31 ENCOUNTER — Other Ambulatory Visit: Payer: Self-pay | Admitting: Family

## 2020-01-25 ENCOUNTER — Other Ambulatory Visit: Payer: Self-pay | Admitting: Family

## 2020-01-25 DIAGNOSIS — G4700 Insomnia, unspecified: Secondary | ICD-10-CM

## 2020-02-04 ENCOUNTER — Other Ambulatory Visit: Payer: Self-pay | Admitting: Family

## 2020-02-04 DIAGNOSIS — Z1231 Encounter for screening mammogram for malignant neoplasm of breast: Secondary | ICD-10-CM

## 2020-03-16 ENCOUNTER — Other Ambulatory Visit: Payer: Self-pay

## 2020-03-16 ENCOUNTER — Ambulatory Visit
Admission: RE | Admit: 2020-03-16 | Discharge: 2020-03-16 | Disposition: A | Discharge status: Home / Self Care | Payer: 59 | Source: Ambulatory Visit | Attending: Family | Admitting: Family

## 2020-03-16 DIAGNOSIS — Z1231 Encounter for screening mammogram for malignant neoplasm of breast: Secondary | ICD-10-CM

## 2021-03-02 ENCOUNTER — Other Ambulatory Visit: Payer: Self-pay

## 2021-03-02 ENCOUNTER — Other Ambulatory Visit: Payer: Self-pay | Admitting: Family

## 2021-03-02 ENCOUNTER — Ambulatory Visit
Admission: RE | Admit: 2021-03-02 | Discharge: 2021-03-02 | Disposition: A | Discharge status: Home / Self Care | Payer: 59 | Source: Ambulatory Visit | Attending: Family | Admitting: Family

## 2021-03-02 DIAGNOSIS — Z1231 Encounter for screening mammogram for malignant neoplasm of breast: Secondary | ICD-10-CM

## 2021-04-06 ENCOUNTER — Other Ambulatory Visit: Payer: Self-pay

## 2021-04-06 ENCOUNTER — Ambulatory Visit
Admission: RE | Admit: 2021-04-06 | Discharge: 2021-04-06 | Disposition: A | Discharge status: Home / Self Care | Payer: 59 | Source: Ambulatory Visit | Attending: Family | Admitting: Family

## 2022-03-28 ENCOUNTER — Encounter: Payer: Self-pay | Admitting: Family

## 2022-03-28 ENCOUNTER — Ambulatory Visit (INDEPENDENT_AMBULATORY_CARE_PROVIDER_SITE_OTHER): Payer: 59 | Admitting: Family

## 2022-03-28 VITALS — BP 121/78 | HR 75 | Temp 97.3°F | Ht 66.0 in | Wt 166.0 lb

## 2022-03-28 DIAGNOSIS — R5383 Other fatigue: Secondary | ICD-10-CM | POA: Diagnosis not present

## 2022-03-28 DIAGNOSIS — R197 Diarrhea, unspecified: Secondary | ICD-10-CM

## 2022-03-28 DIAGNOSIS — Z0001 Encounter for general adult medical examination with abnormal findings: Secondary | ICD-10-CM

## 2022-03-28 DIAGNOSIS — E538 Deficiency of other specified B group vitamins: Secondary | ICD-10-CM

## 2022-03-28 DIAGNOSIS — G4700 Insomnia, unspecified: Secondary | ICD-10-CM

## 2022-03-28 DIAGNOSIS — Z23 Encounter for immunization: Secondary | ICD-10-CM

## 2022-03-28 DIAGNOSIS — F331 Major depressive disorder, recurrent, moderate: Secondary | ICD-10-CM | POA: Diagnosis not present

## 2022-03-28 DIAGNOSIS — K589 Irritable bowel syndrome without diarrhea: Secondary | ICD-10-CM

## 2022-03-28 DIAGNOSIS — Z0000 Encounter for general adult medical examination without abnormal findings: Secondary | ICD-10-CM

## 2022-03-28 DIAGNOSIS — Z1159 Encounter for screening for other viral diseases: Secondary | ICD-10-CM

## 2022-03-28 DIAGNOSIS — E559 Vitamin D deficiency, unspecified: Secondary | ICD-10-CM

## 2022-03-28 DIAGNOSIS — F411 Generalized anxiety disorder: Secondary | ICD-10-CM

## 2022-03-28 MED ORDER — MIRTAZAPINE 15 MG PO TABS: ORAL_TABLET | ORAL | 3 refills | Status: DC

## 2022-03-28 NOTE — Patient Instructions (Signed)
Fatigue If you have fatigue, you feel tired all the time and have a lack of energy or a lack of motivation. Fatigue may make it difficult to start or complete tasks because of exhaustion. Occasional or mild fatigue is often a normal response to activity or life. However, long-term (chronic) or extreme fatigue may be a symptom of a medical condition such as: Depression. Not having enough red blood cells or hemoglobin in the blood (anemia). A problem with a small gland located in the lower front part of the neck (thyroid disorder). Rheumatologic conditions. These are problems related to the body's defense system (immune system). Infections, especially certain viral infections. Fatigue can also lead to negative health outcomes over time. Follow these instructions at home: Medicines Take over-the-counter and prescription medicines only as told by your health care provider. Take a multivitamin if told by your health care provider. Do not use herbal or dietary supplements unless they are approved by your health care provider. Eating and drinking  Avoid heavy meals in the evening. Eat a well-balanced diet, which includes lean proteins, whole grains, plenty of fruits and vegetables, and low-fat dairy products. Avoid eating or drinking too many products with caffeine in them. Avoid alcohol. Drink enough fluid to keep your urine pale yellow. Activity  Exercise regularly, as told by your health care provider. Use or practice techniques to help you relax, such as yoga, tai chi, meditation, or massage therapy. Lifestyle Change situations that cause you stress. Try to keep your work and personal schedules in balance. Do not use recreational or illegal drugs. General instructions Monitor your fatigue for any changes. Go to bed and get up at the same time every day. Avoid fatigue by pacing yourself during the day and getting enough sleep at night. Maintain a healthy weight. Contact a health care  provider if: Your fatigue does not get better. You have a fever. You suddenly lose or gain weight. You have headaches. You have trouble falling asleep or sleeping through the night. You feel angry, guilty, anxious, or sad. You have swelling in your legs or another part of your body. Get help right away if: You feel confused, feel like you might faint, or faint. Your vision is blurry or you have a severe headache. You have severe pain in your abdomen, your back, or the area between your waist and hips (pelvis). You have chest pain, shortness of breath, or an irregular or fast heartbeat. You are unable to urinate, or you urinate less than normal. You have abnormal bleeding from the rectum, nose, lungs, nipples, or, if you are female, the vagina. You vomit blood. You have thoughts about hurting yourself or others. These symptoms may be an emergency. Get help right away. Call 911. Do not wait to see if the symptoms will go away. Do not drive yourself to the hospital. Get help right away if you feel like you may hurt yourself or others, or have thoughts about taking your own life. Go to your nearest emergency room or: Call 911. Call the [facility] at [phone] or 988. This is open 24 hours a day. Text the Crisis Text Line at 979-823-0385. Summary If you have fatigue, you feel tired all the time and have a lack of energy or a lack of motivation. Fatigue may make it difficult to start or complete tasks because of exhaustion. Long-term (chronic) or extreme fatigue may be a symptom of a medical condition. Exercise regularly, as told by your health care provider.  Change situations that cause you stress. Try to keep your work and personal schedules in balance. This information is not intended to replace advice given to you by your health care provider. Make sure you discuss any questions you have with your health care provider. Document Revised: 02/14/2021 Document  Reviewed: 02/14/2021 Elsevier Patient Education  2023 Elsevier Inc.  

## 2022-03-28 NOTE — Progress Notes (Signed)
Subjective:    Patient ID: Kristen Mullins, female    DOB: 1975/06/03, 46 y.o.   MRN: [id number]  Chief Complaint  Patient presents with   Fatigue    Been going on for years    PT presents to the office today to reestablish care and discuss increase fatigue.   She reports she has been having fatigue for years, but has worsen over the last 6 years. She reports she stays at home most days. She has not worked since 2017.   She has IBS with diarrhea and constipation. Stress increases her diarrhea.  Anxiety Presents for follow-up visit. Symptoms include excessive worry, insomnia, irritability, nervous/anxious behavior, palpitations and restlessness. Symptoms occur most days. The severity of symptoms is moderate.    Depression        This is a chronic problem.  The current episode started more than 1 year ago.   Associated symptoms include fatigue, helplessness, hopelessness, insomnia, restlessness and sad.  Past medical history includes anxiety.   Insomnia Primary symptoms: difficulty falling asleep, frequent awakening.   The current episode started more than one year. The onset quality is gradual. The problem occurs intermittently. PMH includes: depression.       Review of Systems  Constitutional:  Positive for fatigue and irritability.  Cardiovascular:  Positive for palpitations.  Psychiatric/Behavioral:  Positive for depression. The patient is nervous/anxious and has insomnia.   All other systems reviewed and are negative.   Family History  Problem Relation Age of Onset   Diabetes Father    Cancer Father        lung,skin   Breast cancer Paternal Grandmother    Social History   Socioeconomic History   Marital status: Married    Spouse name: Not on file   Number of children: 0   Years of education: 12   Highest education level: Not on file  Occupational History    Comment: Not employed  Tobacco Use   Smoking status: Never   Smokeless tobacco: Never  Vaping Use    Vaping Use: Never used  Substance and Sexual Activity   Alcohol use: No   Drug use: No   Sexual activity: Yes    Birth control/protection: Surgical  Other Topics Concern   Not on file  Social History Narrative   Not on file   Social Determinants of Health   Financial Resource Strain: Not on file  Food Insecurity: Not on file  Transportation Needs: Not on file  Physical Activity: Not on file  Stress: Not on file  Social Connections: Not on file       Objective:   Physical Exam Vitals reviewed.  Constitutional:      General: She is not in acute distress.    Appearance: She is well-developed.  HENT:     Head: Normocephalic and atraumatic.     Right Ear: Tympanic membrane normal.     Left Ear: Tympanic membrane normal.  Eyes:     Pupils: Pupils are equal, round, and reactive to light.  Neck:     Thyroid: No thyromegaly.  Cardiovascular:     Rate and Rhythm: Normal rate and regular rhythm.     Heart sounds: Normal heart sounds. No murmur heard. Pulmonary:     Effort: Pulmonary effort is normal. No respiratory distress.     Breath sounds: Normal breath sounds. No wheezing.  Abdominal:     General: Bowel sounds are normal. There is no distension.     Palpations:  Abdomen is soft.     Tenderness: There is no abdominal tenderness.  Musculoskeletal:        General: No tenderness. Normal range of motion.     Cervical back: Normal range of motion and neck supple.  Skin:    General: Skin is warm and dry.  Neurological:     Mental Status: She is alert and oriented to person, place, and time.     Cranial Nerves: No cranial nerve deficit.     Deep Tendon Reflexes: Reflexes are normal and symmetric.  Psychiatric:        Behavior: Behavior normal.        Thought Content: Thought content normal.        Judgment: Judgment normal.      BP 121/78   Pulse 75   Temp (!) 97.3 F (36.3 C) (Temporal)   Ht _0  (1.676 m)   Wt 166 lb (75.3 kg)   LMP 11/26/2014   BMI 26.79  kg/m       Assessment & Plan:  Kristen Mullins comes in today with chief complaint of Fatigue (Been going on for years )   Diagnosis and orders addressed:  1. Annual physical exam - CMP14+EGFR - Anemia Profile B - Lipid panel - TSH - VITAMIN D 25 Hydroxy (Vit-D Deficiency, Fractures) - Hepatitis C antibody  2. Other fatigue - CMP14+EGFR - Anemia Profile B - TSH - VITAMIN D 25 Hydroxy (Vit-D Deficiency, Fractures) - Ambulatory referral to Sleep Studies  3. Vitamin D deficiency - CMP14+EGFR - VITAMIN D 25 Hydroxy (Vit-D Deficiency, Fractures)  4. Vitamin B 12 deficiency - CMP14+EGFR - Anemia Profile B  5. Moderate episode of recurrent major depressive disorder (HCC) - CMP14+EGFR  6. Diarrhea, unspecified type - CMP14+EGFR  7. Generalized anxiety disorder - CMP14+EGFR  8. Irritable bowel syndrome, unspecified type - CMP14+EGFR  9. Insomnia, unspecified type - CMP14+EGFR - mirtazapine (REMERON) 15 MG tablet; Take 53m at night  Dispense: 90 tablet; Refill: 3  10. Need for hepatitis C screening test - CMP14+EGFR - Hepatitis C antibody   Labs pending Health Maintenance reviewed Diet and exercise encouraged  Follow up plan: 6 months    CEvelina Dun FNP

## 2022-03-29 LAB — ANEMIA PROFILE B
Basophils Absolute: 0.1 10*3/uL (ref 0.0–0.2)
Basos: 1 %
EOS (ABSOLUTE): 0.1 10*3/uL (ref 0.0–0.4)
Eos: 3 %
Ferritin: 58 ng/mL (ref 15–150)
Folate: 6.5 ng/mL (ref >3.0)
Hematocrit: 42.6 % (ref 34.0–46.6)
Hemoglobin: 14 g/dL (ref 11.1–15.9)
Immature Grans (Abs): 0 10*3/uL (ref 0.0–0.1)
Immature Granulocytes: 0 %
Iron Saturation: 27 % (ref 15–55)
Iron: 94 ug/dL (ref 27–159)
Lymphocytes Absolute: 1.2 10*3/uL (ref 0.7–3.1)
Lymphs: 25 %
MCH: 29 pg (ref 26.6–33.0)
MCHC: 32.9 g/dL (ref 31.5–35.7)
MCV: 88 fL (ref 79–97)
Monocytes Absolute: 0.4 10*3/uL (ref 0.1–0.9)
Monocytes: 9 %
Neutrophils Absolute: 3.1 10*3/uL (ref 1.4–7.0)
Neutrophils: 62 %
Platelets: 222 10*3/uL (ref 150–450)
RBC: 4.82 x10E6/uL (ref 3.77–5.28)
RDW: 13.2 % (ref 11.7–15.4)
Retic Ct Pct: 1.6 % (ref 0.6–2.6)
Total Iron Binding Capacity: 354 ug/dL (ref 250–450)
UIBC: 260 ug/dL (ref 131–425)
Vitamin B-12: 475 pg/mL (ref 232–1245)
WBC: 4.9 10*3/uL (ref 3.4–10.8)

## 2022-03-29 LAB — CMP14+EGFR
ALT: 22 IU/L (ref 0–32)
AST: 18 IU/L (ref 0–40)
Albumin/Globulin Ratio: 1.3 (ref 1.2–2.2)
Albumin: 4 g/dL (ref 3.9–4.9)
Alkaline Phosphatase: 117 IU/L (ref 44–121)
BUN/Creatinine Ratio: 12 (ref 9–23)
BUN: 10 mg/dL (ref 6–24)
Bilirubin Total: 0.3 mg/dL (ref 0.0–1.2)
CO2: 22 mmol/L (ref 20–29)
Calcium: 9.2 mg/dL (ref 8.7–10.2)
Chloride: 107 mmol/L — ABNORMAL HIGH (ref 96–106)
Creatinine, Ser: 0.86 mg/dL (ref 0.57–1.00)
Globulin, Total: 3.1 g/dL (ref 1.5–4.5)
Glucose: 103 mg/dL — ABNORMAL HIGH (ref 70–99)
Potassium: 4.2 mmol/L (ref 3.5–5.2)
Sodium: 142 mmol/L (ref 134–144)
Total Protein: 7.1 g/dL (ref 6.0–8.5)
eGFR: 84 mL/min/{1.73_m2} (ref >59)

## 2022-03-29 LAB — LIPID PANEL
Chol/HDL Ratio: 4.9 ratio — ABNORMAL HIGH (ref 0.0–4.4)
Cholesterol, Total: 221 mg/dL — ABNORMAL HIGH (ref 100–199)
HDL: 45 mg/dL (ref >39)
LDL Chol Calc (NIH): 125 mg/dL — ABNORMAL HIGH (ref 0–99)
Triglycerides: 288 mg/dL — ABNORMAL HIGH (ref 0–149)
VLDL Cholesterol Cal: 51 mg/dL — ABNORMAL HIGH (ref 5–40)

## 2022-03-29 LAB — TSH: TSH: 2.64 u[IU]/mL (ref 0.450–4.500)

## 2022-03-29 LAB — VITAMIN D 25 HYDROXY (VIT D DEFICIENCY, FRACTURES): Vit D, 25-Hydroxy: 21.3 ng/mL — ABNORMAL LOW (ref 30.0–100.0)

## 2022-03-29 LAB — HEPATITIS C ANTIBODY: Hep C Virus Ab: NONREACTIVE

## 2022-04-03 ENCOUNTER — Other Ambulatory Visit: Payer: Self-pay | Admitting: Family

## 2022-04-03 MED ORDER — VITAMIN D (ERGOCALCIFEROL) 1.25 MG (50000 UNIT) PO CAPS: 50000.0000 [IU] | ORAL_CAPSULE | ORAL | 3 refills | Status: AC

## 2022-04-03 NOTE — Progress Notes (Signed)
Patient returning call. Please call back

## 2022-05-30 ENCOUNTER — Other Ambulatory Visit: Payer: Self-pay | Admitting: Family

## 2022-05-30 DIAGNOSIS — Z1231 Encounter for screening mammogram for malignant neoplasm of breast: Secondary | ICD-10-CM

## 2022-06-05 ENCOUNTER — Ambulatory Visit
Admission: RE | Admit: 2022-06-05 | Discharge: 2022-06-05 | Disposition: A | Discharge status: Home / Self Care | Payer: 59 | Source: Ambulatory Visit | Attending: Family | Admitting: Family

## 2022-06-05 DIAGNOSIS — Z1231 Encounter for screening mammogram for malignant neoplasm of breast: Secondary | ICD-10-CM

## 2023-03-28 ENCOUNTER — Other Ambulatory Visit: Payer: Self-pay | Admitting: Family

## 2023-03-28 DIAGNOSIS — G4700 Insomnia, unspecified: Secondary | ICD-10-CM

## 2023-04-22 ENCOUNTER — Other Ambulatory Visit: Payer: Self-pay | Admitting: Family

## 2023-04-22 DIAGNOSIS — G4700 Insomnia, unspecified: Secondary | ICD-10-CM

## 2023-04-23 ENCOUNTER — Encounter: Payer: Self-pay | Admitting: Family

## 2023-04-23 NOTE — Telephone Encounter (Signed)
LMTCB to schedule appt Letter mailed 

## 2023-04-23 NOTE — Telephone Encounter (Signed)
[facility] pt NTBS 30-d given 03/28/23

## 2023-06-27 ENCOUNTER — Other Ambulatory Visit: Payer: Self-pay | Admitting: Family

## 2023-06-27 DIAGNOSIS — Z1231 Encounter for screening mammogram for malignant neoplasm of breast: Secondary | ICD-10-CM

## 2023-07-04 ENCOUNTER — Ambulatory Visit
Admission: RE | Admit: 2023-07-04 | Discharge: 2023-07-04 | Disposition: A | Discharge status: Home / Self Care | Payer: 59 | Source: Ambulatory Visit | Attending: Family | Admitting: Family

## 2023-07-04 DIAGNOSIS — Z1231 Encounter for screening mammogram for malignant neoplasm of breast: Secondary | ICD-10-CM

## 2024-06-10 ENCOUNTER — Ambulatory Visit: Payer: Self-pay

## 2024-06-10 NOTE — Telephone Encounter (Signed)
 Appt made

## 2024-06-11 ENCOUNTER — Encounter: Payer: Self-pay | Admitting: Family Medicine

## 2024-06-11 ENCOUNTER — Ambulatory Visit: Admitting: Family Medicine

## 2024-06-11 VITALS — BP 117/81 | HR 75 | Temp 97.0°F | Ht 66.0 in | Wt 161.2 lb

## 2024-06-11 DIAGNOSIS — S90221A Contusion of right lesser toe(s) with damage to nail, initial encounter: Secondary | ICD-10-CM | POA: Diagnosis not present

## 2024-06-11 DIAGNOSIS — S90222A Contusion of left lesser toe(s) with damage to nail, initial encounter: Secondary | ICD-10-CM

## 2024-06-11 NOTE — Progress Notes (Signed)
 "    Subjective:  Patient ID: Kristen Mullins, female    DOB: [birth year], 49 y.o.   MRN: [id number]  Patient Care Team: Lavell Bari LABOR, FNP as PCP - General (Family Medicine)   Chief Complaint:  toe nail pain  (Bilateral great toe nail bruising x 1 month )   HPI: Kristen Mullins is a 50 y.o. female presenting on 06/11/2024 for toe nail pain  (Bilateral great toe nail bruising x 1 month )   Kristen Mullins is a 49 year old female who presents with right toe pain and discoloration.  The right toe pain and discoloration began approximately one month ago during yard work, where she was bending down and putting weight on her toes. There was no specific injury at that time. Initially, both toes were discolored, starting as a light shade of blue and progressively becoming darker. The right toe has been painful, especially to touch, for the past four days. She has been managing the symptoms by icing and resting the toes, which provided some relief. She also trimmed the toenail to alleviate discomfort, noticing a 'hair pocket' under the nail.  She recently shoveled snow, which may have exacerbated the pain in the right toe. There is no history of similar issues, and she denies any dark streaks under the toenail.          Relevant past medical, surgical, family, and social history reviewed and updated as indicated.  Allergies and medications reviewed and updated. Data reviewed: Chart in Epic.   Past Medical History:  Diagnosis Date   Abdominal spasms    Anemia    HX   Anxiety    panic attacks   Cancer (HCC)    Skin   Chronic diarrhea    Hx - IBS diet controlled   Chronic fatigue    Depression    no meds   Endometriosis    External hemorrhoids    IBS (irritable bowel syndrome) 01/11/2016   Nausea    History    Past Surgical History:  Procedure Laterality Date   ABDOMINAL HYSTERECTOMY N/A 07/21/2015   Procedure: HYSTERECTOMY ABDOMINAL;  Surgeon: Vonn VEAR Inch, MD;  Location: [facility];  Service:  Gynecology;  Laterality: N/A;   BIOPSY N/A 01/27/2016   Procedure: BIOPSY;  Surgeon: Claudis RAYMOND Rivet, MD;  Location: [facility];  Service: Endoscopy;  Laterality: N/A;   BIOPSY  06/23/2016   Procedure: BIOPSY;  Surgeon: Claudis RAYMOND Rivet, MD;  Location: [facility];  Service: Endoscopy;;  proximal sigmoid colon biopsies   COLONOSCOPY  08/25/2010   COLONOSCOPY     COLONOSCOPY WITH PROPOFOL  N/A 06/23/2016   Procedure: COLONOSCOPY WITH PROPOFOL ;  Surgeon: Claudis RAYMOND Rivet, MD;  Location: [facility];  Service: Endoscopy;  Laterality: N/A;  8:15   diagnosis     ESOPHAGOGASTRODUODENOSCOPY N/A 01/27/2016   Procedure: ESOPHAGOGASTRODUODENOSCOPY (EGD);  Surgeon: Claudis RAYMOND Rivet, MD;  Location: [facility];  Service: Endoscopy;  Laterality: N/A;  9:30   LAPAROSCOPY N/A 06/23/2013   Procedure: LAPAROSCOPY OPERATIVE AND FULGRATION OF ENDOMETRIOSIS;  Surgeon: Truman Corona, MD;  Location: [facility];  Service: Gynecology;  Laterality: N/A;  cervical laceration repair    SALPINGOOPHORECTOMY Bilateral 07/21/2015   Procedure: SALPINGO OOPHORECTOMY;  Surgeon: Vonn VEAR Inch, MD;  Location: [facility];  Service: Gynecology;  Laterality: Bilateral;   WISDOM TOOTH EXTRACTION      Social History   Socioeconomic History   Marital status: Married    Spouse name:  Not on file   Number of children: 0   Years of education: 12   Highest education level: Not on file  Occupational History    Comment: Not employed  Tobacco Use   Smoking status: Never   Smokeless tobacco: Never  Vaping Use   Vaping status: Never Used  Substance and Sexual Activity   Alcohol use: No   Drug use: No   Sexual activity: Yes    Birth control/protection: Surgical  Other Topics Concern   Not on file  Social History Narrative   Not on file   Social Drivers of Health   Tobacco Use: Low Risk (06/11/2024)   Patient History    Smoking Tobacco Use: Never    Smokeless Tobacco Use: Never    Passive Exposure: Not on file  Financial  Resource Strain: Not on file  Food Insecurity: Not on file  Transportation Needs: Not on file  Physical Activity: Not on file  Stress: Not on file  Social Connections: Unknown (09/19/2021)   [facility]   Social Network    Social Network: Not on file  Intimate Partner Violence: Unknown (08/11/2021)   Received from [facility]   HITS    Physically Hurt: Not on file    Insult or Talk Down To: Not on file    Threaten Physical Harm: Not on file    Scream or Curse: Not on file  Depression (PHQ2-9): High Risk (03/28/2022)   Depression (PHQ2-9)    PHQ-2 Score: 12  Alcohol Screen: Not on file  Housing: Not on file  Utilities: Not on file  Health Literacy: Not on file    Outpatient Encounter Medications as of 06/11/2024  Medication Sig   Vitamin D , Ergocalciferol , (DRISDOL ) 1.25 MG (50000 UNIT) CAPS capsule Take 1 capsule (50,000 Units total) by mouth every 7 (seven) days.   acetaminophen  (TYLENOL ) 500 MG tablet Take 500 mg by mouth every 8 (eight) hours as needed for mild pain, moderate pain or headache. Reported on 05/13/2015   busPIRone  (BUSPAR ) 5 MG tablet TAKE 1 TABLET BY MOUTH THREE TIMES A DAY (Patient not taking: Reported on 06/11/2024)   mirtazapine  (REMERON ) 15 MG tablet TAKE 1 TABLET (15MG ) BY MOUTH AT NIGHT **NEEDS TO BE SEEN BEFORE NEXT REFILL**   No facility-administered encounter medications on file as of 06/11/2024.    Allergies[1]  Pertinent ROS per HPI, otherwise unremarkable      Objective:  BP 117/81   Pulse 75   Temp (!) 97 F (36.1 C)   Ht 5' 6 (1.676 m)   Wt 161 lb 3.2 oz (73.1 kg)   LMP 11/26/2014   SpO2 97%   BMI 26.02 kg/m    Wt Readings from Last 3 Encounters:  06/11/24 161 lb 3.2 oz (73.1 kg)  03/28/22 166 lb (75.3 kg)  02/06/19 155 lb 9.6 oz (70.6 kg)    Physical Exam Vitals and nursing note reviewed.  Constitutional:      General: She is not in acute distress.    Appearance: Normal appearance. She is not ill-appearing,  toxic-appearing or diaphoretic.  HENT:     Head: Normocephalic and atraumatic.     Nose: Nose normal.     Mouth/Throat:     Mouth: Mucous membranes are moist.  Eyes:     Conjunctiva/sclera: Conjunctivae normal.     Pupils: Pupils are equal, round, and reactive to light.  Cardiovascular:     Rate and Rhythm: Normal rate and regular rhythm.  Heart sounds: Normal heart sounds.  Musculoskeletal:     Cervical back: Neck supple.     Right lower leg: No edema.     Left lower leg: No edema.  Feet:     Comments: Image of toenail below Skin:    General: Skin is warm and dry.     Capillary Refill: Capillary refill takes less than 2 seconds.  Neurological:     General: No focal deficit present.     Mental Status: She is alert and oriented to person, place, and time.  Psychiatric:        Mood and Affect: Mood normal.        Behavior: Behavior normal.        Thought Content: Thought content normal.        Judgment: Judgment normal.          Results for orders placed or performed in visit on 03/28/22  CMP14+EGFR   Collection Time: 03/28/22  2:59 PM  Result Value Ref Range   Glucose 103 (H) 70 - 99 mg/dL   BUN 10 6 - 24 mg/dL   Creatinine, Ser 9.13 0.57 - 1.00 mg/dL   eGFR 84 >40 fO/fpw/8.26   BUN/Creatinine Ratio 12 9 - 23   Sodium 142 134 - 144 mmol/L   Potassium 4.2 3.5 - 5.2 mmol/L   Chloride 107 (H) 96 - 106 mmol/L   CO2 22 20 - 29 mmol/L   Calcium 9.2 8.7 - 10.2 mg/dL   Total Protein 7.1 6.0 - 8.5 g/dL   Albumin 4.0 3.9 - 4.9 g/dL   Globulin, Total 3.1 1.5 - 4.5 g/dL   Albumin/Globulin Ratio 1.3 1.2 - 2.2   Bilirubin Total 0.3 0.0 - 1.2 mg/dL   Alkaline Phosphatase 117 44 - 121 IU/L   AST 18 0 - 40 IU/L   ALT 22 0 - 32 IU/L  Anemia Profile B   Collection Time: 03/28/22  2:59 PM  Result Value Ref Range   Total Iron Binding Capacity 354 250 - 450 ug/dL   UIBC 739 868 - 574 ug/dL   Iron 94 27 - 840 ug/dL   Iron Saturation 27 15 - 55 %   Ferritin 58 15 - 150 ng/mL    Vitamin B-12 475 232 - 1,245 pg/mL   Folate 6.5 >3.0 ng/mL   WBC 4.9 3.4 - 10.8 x10E3/uL   RBC 4.82 3.77 - 5.28 x10E6/uL   Hemoglobin 14.0 11.1 - 15.9 g/dL   Hematocrit 57.3 65.9 - 46.6 %   MCV 88 79 - 97 fL   MCH 29.0 26.6 - 33.0 pg   MCHC 32.9 31.5 - 35.7 g/dL   RDW 86.7 88.2 - 84.5 %   Platelets 222 150 - 450 x10E3/uL   Neutrophils 62 Not Estab. %   Lymphs 25 Not Estab. %   Monocytes 9 Not Estab. %   Eos 3 Not Estab. %   Basos 1 Not Estab. %   Neutrophils Absolute 3.1 1.4 - 7.0 x10E3/uL   Lymphocytes Absolute 1.2 0.7 - 3.1 x10E3/uL   Monocytes Absolute 0.4 0.1 - 0.9 x10E3/uL   EOS (ABSOLUTE) 0.1 0.0 - 0.4 x10E3/uL   Basophils Absolute 0.1 0.0 - 0.2 x10E3/uL   Immature Granulocytes 0 Not Estab. %   Immature Grans (Abs) 0.0 0.0 - 0.1 x10E3/uL   Retic Ct Pct 1.6 0.6 - 2.6 %  Lipid panel   Collection Time: 03/28/22  2:59 PM  Result Value Ref Range   Cholesterol, Total  221 (H) 100 - 199 mg/dL   Triglycerides 711 (H) 0 - 149 mg/dL   HDL 45 >60 mg/dL   VLDL Cholesterol Cal 51 (H) 5 - 40 mg/dL   LDL Chol Calc (NIH) 874 (H) 0 - 99 mg/dL   Chol/HDL Ratio 4.9 (H) 0.0 - 4.4 ratio  TSH   Collection Time: 03/28/22  2:59 PM  Result Value Ref Range   TSH 2.640 0.450 - 4.500 uIU/mL  VITAMIN D  25 Hydroxy (Vit-D Deficiency, Fractures)   Collection Time: 03/28/22  2:59 PM  Result Value Ref Range   Vit D, 25-Hydroxy 21.3 (L) 30.0 - 100.0 ng/mL  Hepatitis C antibody   Collection Time: 03/28/22  2:59 PM  Result Value Ref Range   Hep C Virus Ab Non Reactive Non Reactive       Pertinent labs & imaging results that were available during my care of the patient were reviewed by me and considered in my medical decision making.  Assessment & Plan:  Sammy was seen today for toe nail pain .  Diagnoses and all orders for this visit:  Subungual hematoma of toe of left foot, initial encounter -     Ambulatory referral to Podiatry  Subungual hematoma of toe of right foot, initial  encounter -     Ambulatory referral to Podiatry     Subungual hematoma of right and left toes Subungual hematoma present for one month, initially light blue and darkened over time. Right toe is symptomatic with pain upon touch, while the left toe is asymptomatic. Likely due to trauma from bending during yard work. Differential diagnosis includes melanoma, but presentation is more consistent with hematoma. Risk of toenail detachment due to air pocket under the nail. If the toenail detaches, it will regrow without tenderness due to the thick sheath under the nail. - Referred to podiatrist for evaluation and potential removal of hematoma. - Advised to monitor for improvement and cancel podiatry appointment if symptoms resolve. - Instructed to seek podiatry evaluation if toenail continues to grow out black or if symptoms worsen.        Continue all other maintenance medications.  Follow up plan: Return if symptoms worsen or fail to improve.   Continue healthy lifestyle choices, including diet (rich in fruits, vegetables, and lean proteins, and low in salt and simple carbohydrates) and exercise (at least 30 minutes of moderate physical activity daily).  Educational handout given for subungual hematoma   The above assessment and management plan was discussed with the patient. The patient verbalized understanding of and has agreed to the management plan. Patient is aware to call the clinic if they develop any new symptoms or if symptoms persist or worsen. Patient is aware when to return to the clinic for a follow-up visit. Patient educated on when it is appropriate to go to the emergency department.   Rosaline Bruns, FNP-C [facility] [phone]     [1]  Allergies Allergen Reactions   Bentyl  [Dicyclomine  Hcl] Nausea Only   Hydrocodone Nausea And Vomiting   "
